# Patient Record
Sex: Female | Born: 1975 | Hispanic: Yes | Marital: Married | Smoking: Never smoker
Health system: Southern US, Community
[De-identification: ages and names within clinical notes are randomized; demographics above are authoritative.]

## PROBLEM LIST (undated history)

## (undated) DIAGNOSIS — E782 Mixed hyperlipidemia: Secondary | ICD-10-CM

## (undated) DIAGNOSIS — R7303 Prediabetes: Secondary | ICD-10-CM

## (undated) DIAGNOSIS — J309 Allergic rhinitis, unspecified: Secondary | ICD-10-CM

## (undated) HISTORY — DX: Mixed hyperlipidemia: E78.2

## (undated) HISTORY — DX: Allergic rhinitis, unspecified: J30.9

## (undated) HISTORY — DX: Prediabetes: R73.03

---

## 2017-12-06 ENCOUNTER — Other Ambulatory Visit: Payer: Self-pay | Admitting: Obstetrics and Gynecology

## 2017-12-06 DIAGNOSIS — Z1231 Encounter for screening mammogram for malignant neoplasm of breast: Secondary | ICD-10-CM

## 2017-12-28 ENCOUNTER — Ambulatory Visit
Admission: RE | Admit: 2017-12-28 | Discharge: 2017-12-28 | Disposition: A | Discharge status: Home / Self Care | Payer: 59 | Source: Ambulatory Visit | Attending: Obstetrics and Gynecology | Admitting: Obstetrics and Gynecology

## 2017-12-28 ENCOUNTER — Encounter: Payer: Self-pay | Admitting: Radiology

## 2017-12-28 DIAGNOSIS — Z1231 Encounter for screening mammogram for malignant neoplasm of breast: Secondary | ICD-10-CM | POA: Diagnosis present

## 2018-10-16 ENCOUNTER — Telehealth: Payer: Self-pay | Admitting: Obstetrics & Gynecology

## 2018-10-16 NOTE — Telephone Encounter (Signed)
[facility] referring for Irregular Menses. Called and left voicemail for patient to call back to be schedule

## 2018-10-17 NOTE — Telephone Encounter (Signed)
Called and left voice mail for patient to call back to be schedule °

## 2018-10-18 NOTE — Telephone Encounter (Signed)
Called and left voice mail for patient to call back to be schedule °

## 2018-10-23 NOTE — Telephone Encounter (Signed)
Called with spanish interpreter left voicemail for patient to call back to be schedule

## 2019-02-11 ENCOUNTER — Other Ambulatory Visit: Payer: Self-pay | Admitting: Internal Medicine

## 2019-02-11 DIAGNOSIS — Z1231 Encounter for screening mammogram for malignant neoplasm of breast: Secondary | ICD-10-CM

## 2019-03-08 ENCOUNTER — Ambulatory Visit
Admission: RE | Admit: 2019-03-08 | Discharge: 2019-03-08 | Disposition: A | Discharge status: Home / Self Care | Payer: 59 | Source: Ambulatory Visit | Attending: Internal Medicine | Admitting: Internal Medicine

## 2019-03-08 DIAGNOSIS — Z1231 Encounter for screening mammogram for malignant neoplasm of breast: Secondary | ICD-10-CM | POA: Diagnosis not present

## 2019-03-19 ENCOUNTER — Encounter: Payer: Self-pay | Admitting: Obstetrics and Gynecology

## 2019-03-19 ENCOUNTER — Other Ambulatory Visit: Payer: Self-pay

## 2019-03-19 ENCOUNTER — Ambulatory Visit (INDEPENDENT_AMBULATORY_CARE_PROVIDER_SITE_OTHER): Payer: 59 | Admitting: Obstetrics and Gynecology

## 2019-03-19 VITALS — BP 100/60 | Ht 59.0 in | Wt 166.0 lb

## 2019-03-19 DIAGNOSIS — B373 Candidiasis of vulva and vagina: Secondary | ICD-10-CM

## 2019-03-19 DIAGNOSIS — N912 Amenorrhea, unspecified: Secondary | ICD-10-CM | POA: Diagnosis not present

## 2019-03-19 DIAGNOSIS — B3731 Acute candidiasis of vulva and vagina: Secondary | ICD-10-CM

## 2019-03-19 LAB — POCT WET PREP WITH KOH
Clue Cells Wet Prep HPF POC: NEGATIVE
KOH Prep POC: NEGATIVE
Trichomonas, UA: NEGATIVE
Yeast Wet Prep HPF POC: NEGATIVE

## 2019-03-19 MED ORDER — FLUCONAZOLE 150 MG PO TABS: 150.0000 mg | ORAL_TABLET | Freq: Once | ORAL | 0 refills | Status: AC

## 2019-03-19 NOTE — Patient Instructions (Signed)
I value your feedback and entrusting us with your care. If you get a [facility] patient survey, I would appreciate you taking the time to let us know about your experience today. Thank you!  As of December 13, 2018, your lab results will be released to your MyChart immediately, before I even have a chance to see them. Please give me time to review them and contact you if there are any abnormalities. Thank you for your patience.  

## 2019-03-19 NOTE — Progress Notes (Signed)
Medicine, [facility]   Chief Complaint  Patient presents with  . Metrorrhagia    last period was in Oct  . Vaginal Discharge    itching, no odor x 1 week    HPI:      Ms. Linda Simon is a 44 y.o. No obstetric history on file. who LMP was Patient's last menstrual period was 10/08/2018 (approximate)., presents today for NP eval of amenorrhea and vaginal symptoms. Pt is on OCPs, usually has monthly menses, lasting 3 days, no BTB, mild dysmen in back, improved with tylenol. No menses since 10/20. Has had stress and weight changes. Unusual for pt.  She is sex active, using OCPs. No pain/postcoital bleeding.  Also with increased vag d/c with itch/irritation, no fishy odor, for the past wk. No meds to treat, no recent abx use. Uses dove sensitive skin soap, no detergent/soap changes.   Neg pap/neg HPV DNA 12/01/16 at [facility]  History reviewed. No pertinent past medical history.  History reviewed. No pertinent surgical history.  Family History  Problem Relation Age of Onset  . Diabetes Mother     Social History   Socioeconomic History  . Marital status: Married    Spouse name: Not on file  . Number of children: Not on file  . Years of education: Not on file  . Highest education level: Not on file  Occupational History  . Not on file  Tobacco Use  . Smoking status: Never Smoker  . Smokeless tobacco: Never Used  Substance and Sexual Activity  . Alcohol use: Never  . Drug use: Never  . Sexual activity: Yes    Birth control/protection: Pill  Other Topics Concern  . Not on file  Social History Narrative  . Not on file   Social Determinants of Health   Financial Resource Strain:   . Difficulty of Paying Living Expenses:   Food Insecurity:   . Worried About Programme researcher, broadcasting/film/video in the Last Year:   . Barista in the Last Year:   Transportation Needs:   . Freight forwarder (Medical):   Marland Kitchen Lack of Transportation (Non-Medical):   Physical Activity:   . Days  of Exercise per Week:   . Minutes of Exercise per Session:   Stress:   . Feeling of Stress :   Social Connections:   . Frequency of Communication with Friends and Family:   . Frequency of Social Gatherings with Friends and Family:   . Attends Religious Services:   . Active Member of Clubs or Organizations:   . Attends Banker Meetings:   Marland Kitchen Marital Status:   Intimate Partner Violence:   . Fear of Current or Ex-Partner:   . Emotionally Abused:   Marland Kitchen Physically Abused:   . Sexually Abused:     Outpatient Medications Prior to Visit  Medication Sig Dispense Refill  . Norethindrone Acetate-Ethinyl Estrad-FE (LOESTRIN 24 FE) 1-20 MG-MCG(24) tablet Take by mouth.     No facility-administered medications prior to visit.      ROS:  Review of Systems  Constitutional: Negative for fatigue, fever and unexpected weight change.  Respiratory: Negative for cough, shortness of breath and wheezing.   Cardiovascular: Negative for chest pain, palpitations and leg swelling.  Gastrointestinal: Negative for blood in stool, constipation, diarrhea, nausea and vomiting.  Endocrine: Negative for cold intolerance, heat intolerance and polyuria.  Genitourinary: Positive for menstrual problem and vaginal discharge. Negative for dyspareunia, dysuria, flank pain, frequency, genital sores, hematuria,  pelvic pain, urgency, vaginal bleeding and vaginal pain.  Musculoskeletal: Negative for back pain, joint swelling and myalgias.  Skin: Negative for rash.  Neurological: Negative for dizziness, syncope, light-headedness, numbness and headaches.  Hematological: Negative for adenopathy.  Psychiatric/Behavioral: Negative for agitation, confusion, sleep disturbance and suicidal ideas. The patient is not nervous/anxious.   BREAST: No symptoms   OBJECTIVE:   Vitals:  BP 100/60   Ht 4\' 11"  (1.499 m)   Wt 166 lb (75.3 kg)   LMP 10/08/2018 (Approximate)   BMI 33.53 kg/m   Physical Exam Vitals  reviewed.  Constitutional:      Appearance: She is well-developed.  Pulmonary:     Effort: Pulmonary effort is normal.  Genitourinary:    General: Normal vulva.     Pubic Area: No rash.      Labia:        Right: No rash, tenderness or lesion.        Left: No rash, tenderness or lesion.      Vagina: Vaginal discharge present. No erythema or tenderness.     Cervix: Normal.     Uterus: Normal. Not enlarged and not tender.      Adnexa: Right adnexa normal and left adnexa normal.       Right: No mass or tenderness.         Left: No mass or tenderness.    Musculoskeletal:        General: Normal range of motion.     Cervical back: Normal range of motion.  Skin:    General: Skin is warm and dry.  Neurological:     General: No focal deficit present.     Mental Status: She is alert and oriented to person, place, and time.  Psychiatric:        Mood and Affect: Mood normal.        Behavior: Behavior normal.        Thought Content: Thought content normal.        Judgment: Judgment normal.     Results: Results for orders placed or performed in visit on 03/19/19 (from the past 24 hour(s))  POCT Wet Prep with KOH     Status: Normal   Collection Time: 03/19/19  4:14 PM  Result Value Ref Range   Trichomonas, UA Negative    Clue Cells Wet Prep HPF POC neg    Epithelial Wet Prep HPF POC     Yeast Wet Prep HPF POC neg    Bacteria Wet Prep HPF POC     RBC Wet Prep HPF POC     WBC Wet Prep HPF POC     KOH Prep POC Negative Negative     Assessment/Plan: Amenorrhea - Plan: TSH; Most likely due to OCPs. Check TSH. If WNL, reassurance. F/u prn.   Candidal vaginitis - Plan: fluconazole (DIFLUCAN) 150 MG tablet, POCT Wet Prep with KOH; Pos sx and exam, neg wet prep. Rx diflucan. F/u prn.    Meds ordered this encounter  Medications  . fluconazole (DIFLUCAN) 150 MG tablet    Sig: Take 1 tablet (150 mg total) by mouth once for 1 dose.    Dispense:  1 tablet    Refill:  0    Order  Specific Question:   Supervising Provider    Answer:   Gae Dry [[id number]]      Return if symptoms worsen or fail to improve.  Tyriq Moragne B. Yemaya Barnier, PA-C 03/19/2019 4:15 PM

## 2019-03-20 LAB — TSH: TSH: 2.07 u[IU]/mL (ref 0.450–4.500)

## 2019-03-20 NOTE — Progress Notes (Signed)
Called pt, no answer, LVMTRC. 

## 2019-03-20 NOTE — Progress Notes (Signed)
Pls let pt know her thyroid is normal. No bleeding on birth control can be from weight changes and stress. She can also do home UPT just to make sure. She speaks [place]. Thx.

## 2019-03-20 NOTE — Progress Notes (Signed)
Pt aware.

## 2019-03-24 ENCOUNTER — Ambulatory Visit: Payer: 59 | Attending: Internal Medicine

## 2019-03-24 DIAGNOSIS — Z23 Encounter for immunization: Secondary | ICD-10-CM

## 2019-03-24 NOTE — Progress Notes (Signed)
   Covid-19 Vaccination Clinic  Name:  Linda Simon    MRN: [id number] DOB: 1975/10/10  03/24/2019  [name] was observed post Covid-19 immunization for 15 minutes without incident. She was provided with Vaccine Information Sheet and instruction to access the V-Safe system.   [name] was instructed to call 911 with any severe reactions post vaccine: Marland Kitchen Difficulty breathing  . Swelling of face and throat  . A fast heartbeat  . A bad rash all over body  . Dizziness and weakness   Immunizations Administered    Name Date Dose VIS Date Route   Pfizer COVID-19 Vaccine 03/24/2019  6:50 PM 0.3 mL 12/14/2018 Intramuscular   Manufacturer: [employer], [employer]   Lot: [id number]   NDC: [address]-1

## 2019-04-14 ENCOUNTER — Other Ambulatory Visit: Payer: Self-pay

## 2019-04-14 ENCOUNTER — Ambulatory Visit: Payer: 59 | Attending: Internal Medicine

## 2019-04-14 DIAGNOSIS — Z23 Encounter for immunization: Secondary | ICD-10-CM

## 2019-04-14 NOTE — Progress Notes (Addendum)
   Covid-19 Vaccination Clinic  Name:  Linda Simon    MRN: [id number] DOB: 08-15-1975  04/14/2019  [name] was observed post Covid-19 immunization for 15 minutes without incident. She was provided with Vaccine Information Sheet and instruction to access the V-Safe system. Medical interpreter used.  [name] was instructed to call 911 with any severe reactions post vaccine: Marland Kitchen Difficulty breathing  . Swelling of face and throat  . A fast heartbeat  . A bad rash all over body  . Dizziness and weakness   Immunizations Administered    Name Date Dose VIS Date Route   Pfizer COVID-19 Vaccine 04/14/2019  4:47 PM 0.3 mL 12/14/2018 Intramuscular   Manufacturer: [employer], [employer]   Lot: [id number] [id number]   NDC: [address]-2

## 2020-06-11 ENCOUNTER — Other Ambulatory Visit: Payer: Self-pay | Admitting: Family

## 2020-06-11 DIAGNOSIS — Z1231 Encounter for screening mammogram for malignant neoplasm of breast: Secondary | ICD-10-CM

## 2020-07-10 ENCOUNTER — Ambulatory Visit
Admission: RE | Admit: 2020-07-10 | Discharge: 2020-07-10 | Disposition: A | Discharge status: Home / Self Care | Payer: 59 | Source: Ambulatory Visit | Attending: Family | Admitting: Family

## 2020-07-10 ENCOUNTER — Other Ambulatory Visit: Payer: Self-pay

## 2020-07-10 DIAGNOSIS — Z1231 Encounter for screening mammogram for malignant neoplasm of breast: Secondary | ICD-10-CM | POA: Insufficient documentation

## 2021-09-02 ENCOUNTER — Other Ambulatory Visit: Payer: Self-pay | Admitting: Family

## 2021-09-02 DIAGNOSIS — Z1231 Encounter for screening mammogram for malignant neoplasm of breast: Secondary | ICD-10-CM

## 2021-09-27 ENCOUNTER — Encounter: Payer: Self-pay | Admitting: Radiology

## 2021-09-27 ENCOUNTER — Ambulatory Visit
Admission: RE | Admit: 2021-09-27 | Discharge: 2021-09-27 | Disposition: A | Discharge status: Home / Self Care | Payer: 59 | Source: Ambulatory Visit | Attending: Family | Admitting: Family

## 2021-09-27 DIAGNOSIS — Z1231 Encounter for screening mammogram for malignant neoplasm of breast: Secondary | ICD-10-CM | POA: Diagnosis present

## 2022-03-15 ENCOUNTER — Other Ambulatory Visit: Payer: 59

## 2022-03-15 ENCOUNTER — Other Ambulatory Visit: Payer: Self-pay | Admitting: Family

## 2022-03-15 DIAGNOSIS — E559 Vitamin D deficiency, unspecified: Secondary | ICD-10-CM

## 2022-03-15 DIAGNOSIS — I10 Essential (primary) hypertension: Secondary | ICD-10-CM

## 2022-03-15 DIAGNOSIS — E782 Mixed hyperlipidemia: Secondary | ICD-10-CM

## 2022-03-15 DIAGNOSIS — E538 Deficiency of other specified B group vitamins: Secondary | ICD-10-CM

## 2022-03-15 DIAGNOSIS — R7303 Prediabetes: Secondary | ICD-10-CM

## 2022-03-16 LAB — LIPID PANEL
Chol/HDL Ratio: 3.5 ratio (ref 0.0–4.4)
Cholesterol, Total: 170 mg/dL (ref 100–199)
HDL: 48 mg/dL (ref >39)
LDL Chol Calc (NIH): 108 mg/dL — ABNORMAL HIGH (ref 0–99)
Triglycerides: 74 mg/dL (ref 0–149)
VLDL Cholesterol Cal: 14 mg/dL (ref 5–40)

## 2022-03-16 LAB — CBC WITH DIFFERENTIAL
Basophils Absolute: 0.1 10*3/uL (ref 0.0–0.2)
Basos: 1 %
EOS (ABSOLUTE): 0.2 10*3/uL (ref 0.0–0.4)
Eos: 3 %
Hematocrit: 41.9 % (ref 34.0–46.6)
Hemoglobin: 13.5 g/dL (ref 11.1–15.9)
Immature Grans (Abs): 0 10*3/uL (ref 0.0–0.1)
Immature Granulocytes: 0 %
Lymphocytes Absolute: 2.2 10*3/uL (ref 0.7–3.1)
Lymphs: 33 %
MCH: 30 pg (ref 26.6–33.0)
MCHC: 32.2 g/dL (ref 31.5–35.7)
MCV: 93 fL (ref 79–97)
Monocytes Absolute: 0.5 10*3/uL (ref 0.1–0.9)
Monocytes: 7 %
Neutrophils Absolute: 3.8 10*3/uL (ref 1.4–7.0)
Neutrophils: 56 %
RBC: 4.5 x10E6/uL (ref 3.77–5.28)
RDW: 12.5 % (ref 11.7–15.4)
WBC: 6.8 10*3/uL (ref 3.4–10.8)

## 2022-03-16 LAB — CMP14+EGFR
ALT: 12 IU/L (ref 0–32)
AST: 17 IU/L (ref 0–40)
Albumin/Globulin Ratio: 1.6 (ref 1.2–2.2)
Albumin: 4.3 g/dL (ref 3.9–4.9)
Alkaline Phosphatase: 78 IU/L (ref 44–121)
BUN/Creatinine Ratio: 17 (ref 9–23)
BUN: 12 mg/dL (ref 6–24)
Bilirubin Total: 0.2 mg/dL (ref 0.0–1.2)
CO2: 21 mmol/L (ref 20–29)
Calcium: 9.2 mg/dL (ref 8.7–10.2)
Chloride: 106 mmol/L (ref 96–106)
Creatinine, Ser: 0.71 mg/dL (ref 0.57–1.00)
Globulin, Total: 2.7 g/dL (ref 1.5–4.5)
Glucose: 90 mg/dL (ref 70–99)
Potassium: 4.7 mmol/L (ref 3.5–5.2)
Sodium: 142 mmol/L (ref 134–144)
Total Protein: 7 g/dL (ref 6.0–8.5)
eGFR: 107 mL/min/{1.73_m2} (ref >59)

## 2022-03-16 LAB — VITAMIN D 25 HYDROXY (VIT D DEFICIENCY, FRACTURES): Vit D, 25-Hydroxy: 33.4 ng/mL (ref 30.0–100.0)

## 2022-03-16 LAB — VITAMIN B12: Vitamin B-12: 229 pg/mL — ABNORMAL LOW (ref 232–1245)

## 2022-03-16 LAB — HEMOGLOBIN A1C
Est. average glucose Bld gHb Est-mCnc: 120 mg/dL
Hgb A1c MFr Bld: 5.8 % — ABNORMAL HIGH (ref 4.8–5.6)

## 2022-03-17 ENCOUNTER — Ambulatory Visit: Payer: 59 | Admitting: Family

## 2022-03-17 VITALS — BP 130/78 | HR 70 | Ht 60.0 in | Wt 163.0 lb

## 2022-03-17 DIAGNOSIS — R7303 Prediabetes: Secondary | ICD-10-CM | POA: Diagnosis not present

## 2022-03-17 DIAGNOSIS — E782 Mixed hyperlipidemia: Secondary | ICD-10-CM

## 2022-03-17 DIAGNOSIS — J301 Allergic rhinitis due to pollen: Secondary | ICD-10-CM | POA: Diagnosis not present

## 2022-03-17 MED ORDER — FEXOFENADINE HCL 180 MG PO TABS: 180.0000 mg | ORAL_TABLET | Freq: Every day | ORAL | 1 refills | Status: DC

## 2022-03-17 MED ORDER — AZELASTINE HCL 0.1 % NA SOLN: 1.0000 | Freq: Two times a day (BID) | NASAL | 12 refills | Status: AC

## 2022-03-17 NOTE — Progress Notes (Incomplete)
Established Patient Office Visit  Subjective:  Patient ID: Linda Simon, female    DOB: 08/05/1975  Age: 47 y.o. MRN: [id number]  Chief Complaint  Patient presents with  . Follow-up    6 month follow up    HPI   No past medical history on file.  No past surgical history on file.  Social History   Socioeconomic History  . Marital status: Married    Spouse name: Not on file  . Number of children: Not on file  . Years of education: Not on file  . Highest education level: Not on file  Occupational History  . Not on file  Tobacco Use  . Smoking status: Never  . Smokeless tobacco: Never  Vaping Use  . Vaping Use: Never used  Substance and Sexual Activity  . Alcohol use: Never  . Drug use: Never  . Sexual activity: Yes    Birth control/protection: Pill  Other Topics Concern  . Not on file  Social History Narrative  . Not on file   Social Determinants of Health   Financial Resource Strain: Not on file  Food Insecurity: Not on file  Transportation Needs: Not on file  Physical Activity: Not on file  Stress: Not on file  Social Connections: Not on file  Intimate Partner Violence: Not on file    Family History  Problem Relation Age of Onset  . Diabetes Mother     No Known Allergies  ROS     Objective:   BP 130/78   Pulse 70   Ht 5' (1.524 m)   Wt 163 lb (73.9 kg)   SpO2 99%   BMI 31.83 kg/m   Vitals:   03/17/22 1527  BP: 130/78  Pulse: 70  Height: 5' (1.524 m)  Weight: 163 lb (73.9 kg)  SpO2: 99%  BMI (Calculated): 31.83    Physical Exam   No results found for any visits on 03/17/22.  Recent Results (from the past 2160 hour(s))  Lipid panel     Status: Abnormal   Collection Time: 03/15/22  4:08 PM  Result Value Ref Range   Cholesterol, Total 170 100 - 199 mg/dL   Triglycerides 74 0 - 149 mg/dL   HDL 48 >39 mg/dL   VLDL Cholesterol Cal 14 5 - 40 mg/dL   LDL Chol Calc (NIH) 108 (H) 0 - 99 mg/dL   Chol/HDL Ratio 3.5 0.0 - 4.4 ratio     Comment:                                   T. Chol/HDL Ratio                                             Men  Women                               1/2 Avg.Risk  3.4    3.3                                   Avg.Risk  5.0    4.4  2X Avg.Risk  9.6    7.1                                3X Avg.Risk 23.4   11.0   VITAMIN D 25 Hydroxy (Vit-D Deficiency, Fractures)     Status: None   Collection Time: 03/15/22  4:08 PM  Result Value Ref Range   Vit D, 25-Hydroxy 33.4 30.0 - 100.0 ng/mL    Comment: Vitamin D deficiency has been defined by the [place] practice guideline as a level of serum 25-OH vitamin D less than 20 ng/mL (1,2). The Endocrine Society went on to further define vitamin D insufficiency as a level between 21 and 29 ng/mL (2). 1. IOM (Institute of Medicine). 2010. Dietary reference    intakes for calcium and D. [place]: The    [employer]. 2. Holick MF, Binkley [place], Bischoff-Ferrari HA, et al.    Evaluation, treatment, and prevention of vitamin D    deficiency: an Endocrine Society clinical practice    guideline. JCEM. 2011 Jul; 96(7):1911-30.   CBC With Differential     Status: None   Collection Time: 03/15/22  4:08 PM  Result Value Ref Range   WBC 6.8 3.4 - 10.8 x10E3/uL   RBC 4.50 3.77 - 5.28 x10E6/uL   Hemoglobin 13.5 11.1 - 15.9 g/dL   Hematocrit 41.9 34.0 - 46.6 %   MCV 93 79 - 97 fL   MCH 30.0 26.6 - 33.0 pg   MCHC 32.2 31.5 - 35.7 g/dL   RDW 12.5 11.7 - 15.4 %   Neutrophils 56 Not Estab. %   Lymphs 33 Not Estab. %   Monocytes 7 Not Estab. %   Eos 3 Not Estab. %   Basos 1 Not Estab. %   Neutrophils Absolute 3.8 1.4 - 7.0 x10E3/uL   Lymphocytes Absolute 2.2 0.7 - 3.1 x10E3/uL   Monocytes Absolute 0.5 0.1 - 0.9 x10E3/uL   EOS (ABSOLUTE) 0.2 0.0 - 0.4 x10E3/uL   Basophils Absolute 0.1 0.0 - 0.2 x10E3/uL   Immature Granulocytes 0 Not Estab. %   Immature Grans (Abs) 0.0 0.0 -  0.1 x10E3/uL  CMP14+EGFR     Status: None   Collection Time: 03/15/22  4:08 PM  Result Value Ref Range   Glucose 90 70 - 99 mg/dL   BUN 12 6 - 24 mg/dL   Creatinine, Ser 0.71 0.57 - 1.00 mg/dL   eGFR 107 >59 mL/min/1.73   BUN/Creatinine Ratio 17 9 - 23   Sodium 142 134 - 144 mmol/L   Potassium 4.7 3.5 - 5.2 mmol/L   Chloride 106 96 - 106 mmol/L   CO2 21 20 - 29 mmol/L   Calcium 9.2 8.7 - 10.2 mg/dL   Total Protein 7.0 6.0 - 8.5 g/dL   Albumin 4.3 3.9 - 4.9 g/dL   Globulin, Total 2.7 1.5 - 4.5 g/dL   Albumin/Globulin Ratio 1.6 1.2 - 2.2   Bilirubin Total 0.2 0.0 - 1.2 mg/dL   Alkaline Phosphatase 78 44 - 121 IU/L   AST 17 0 - 40 IU/L   ALT 12 0 - 32 IU/L  Hemoglobin A1c     Status: Abnormal   Collection Time: 03/15/22  4:08 PM  Result Value Ref Range   Hgb A1c MFr Bld 5.8 (H) 4.8 - 5.6 %    Comment:  Prediabetes: 5.7 - 6.4          Diabetes: >6.4          Glycemic control for adults with diabetes: <7.0    Est. average glucose Bld gHb Est-mCnc 120 mg/dL  Vitamin B12     Status: Abnormal   Collection Time: 03/15/22  4:08 PM  Result Value Ref Range   Vitamin B-12 229 (L) 232 - 1,245 pg/mL      Assessment & Plan:   Problem List Items Addressed This Visit   None   No follow-ups on file.   Total time spent: {AMA time spent:29001} minutes  Mechele Claude, FNP  03/17/2022

## 2022-03-18 DIAGNOSIS — E782 Mixed hyperlipidemia: Secondary | ICD-10-CM | POA: Insufficient documentation

## 2022-03-18 DIAGNOSIS — R7303 Prediabetes: Secondary | ICD-10-CM | POA: Insufficient documentation

## 2022-03-18 DIAGNOSIS — J301 Allergic rhinitis due to pollen: Secondary | ICD-10-CM | POA: Insufficient documentation

## 2022-03-19 ENCOUNTER — Encounter: Payer: Self-pay | Admitting: Family

## 2022-03-19 NOTE — Progress Notes (Signed)
Established Patient Office Visit  Subjective:  Patient ID: Linda Simon, female    DOB: 1975-02-06  Age: 47 y.o. MRN: [id number]  Chief Complaint  Patient presents with   Follow-up    6 month follow up    Patient is here today for her 6 months follow up.  She has been feeling well since last appointment.   She does have additional concerns to discuss today.  Mostly her allergies have started to act up, and she asks if there is anything we can do about that.  She says that the meds that we sent her last year did work.   Labs were done earlier this week, so we will review today. Her LDL is still elevated, and her A1C is still in prediabetic ranges.  We have discussed the need to watch her intake carefully.   She needs refills.   I have reviewed her active problem list, medication list, allergies, family history, and recent lab results for her appointment today.     No other concerns at this time.   Past Medical History:  Diagnosis Date   Allergic rhinitis    Mixed hyperlipidemia    Prediabetes     No past surgical history on file.  Social History   Socioeconomic History   Marital status: Married    Spouse name: Not on file   Number of children: Not on file   Years of education: Not on file   Highest education level: Not on file  Occupational History   Not on file  Tobacco Use   Smoking status: Never   Smokeless tobacco: Never  Vaping Use   Vaping Use: Never used  Substance and Sexual Activity   Alcohol use: Never   Drug use: Never   Sexual activity: Yes    Birth control/protection: Pill  Other Topics Concern   Not on file  Social History Narrative   Not on file   Social Determinants of Health   Financial Resource Strain: Not on file  Food Insecurity: Not on file  Transportation Needs: Not on file  Physical Activity: Not on file  Stress: Not on file  Social Connections: Not on file  Intimate Partner Violence: Not on file    Family History   Problem Relation Age of Onset   Diabetes Mother     No Known Allergies  Review of Systems  HENT:  Positive for congestion.   All other systems reviewed and are negative.      Objective:   BP 130/78   Pulse 70   Ht 5' (1.524 m)   Wt 163 lb (73.9 kg)   SpO2 99%   BMI 31.83 kg/m   Vitals:   03/17/22 1527  BP: 130/78  Pulse: 70  Height: 5' (1.524 m)  Weight: 163 lb (73.9 kg)  SpO2: 99%  BMI (Calculated): 31.83    Physical Exam Vitals and nursing note reviewed.  Constitutional:      Appearance: Normal appearance. She is normal weight.  HENT:     Head: Normocephalic.     Nose: Congestion and rhinorrhea present.  Eyes:     Extraocular Movements: Extraocular movements intact.     Conjunctiva/sclera: Conjunctivae normal.     Pupils: Pupils are equal, round, and reactive to light.  Cardiovascular:     Rate and Rhythm: Normal rate and regular rhythm.     Pulses: Normal pulses.     Heart sounds: Normal heart sounds.  Pulmonary:     Effort: Pulmonary  effort is normal.     Breath sounds: Normal breath sounds.  Skin:    Capillary Refill: Capillary refill takes less than 2 seconds.  Neurological:     General: No focal deficit present.     Mental Status: She is alert and oriented to person, place, and time.  Psychiatric:        Mood and Affect: Mood normal.        Behavior: Behavior normal.        Thought Content: Thought content normal.        Judgment: Judgment normal.      No results found for any visits on 03/17/22.  Recent Results (from the past 2160 hour(s))  Lipid panel     Status: Abnormal   Collection Time: 03/15/22  4:08 PM  Result Value Ref Range   Cholesterol, Total 170 100 - 199 mg/dL   Triglycerides 74 0 - 149 mg/dL   HDL 48 >39 mg/dL   VLDL Cholesterol Cal 14 5 - 40 mg/dL   LDL Chol Calc (NIH) 108 (H) 0 - 99 mg/dL   Chol/HDL Ratio 3.5 0.0 - 4.4 ratio    Comment:                                   T. Chol/HDL Ratio                                              Men  Women                               1/2 Avg.Risk  3.4    3.3                                   Avg.Risk  5.0    4.4                                2X Avg.Risk  9.6    7.1                                3X Avg.Risk 23.4   11.0   VITAMIN D 25 Hydroxy (Vit-D Deficiency, Fractures)     Status: None   Collection Time: 03/15/22  4:08 PM  Result Value Ref Range   Vit D, 25-Hydroxy 33.4 30.0 - 100.0 ng/mL    Comment: Vitamin D deficiency has been defined by the [place] practice guideline as a level of serum 25-OH vitamin D less than 20 ng/mL (1,2). The Endocrine Society went on to further define vitamin D insufficiency as a level between 21 and 29 ng/mL (2). 1. IOM (Institute of Medicine). 2010. Dietary reference    intakes for calcium and D. [place]: The    [employer]. 2. Holick MF, Binkley [place], Bischoff-Ferrari HA, et al.    Evaluation, treatment, and prevention of vitamin D    deficiency: an Endocrine Society clinical practice    guideline. JCEM. 2011 Jul; 96(7):1911-30.   CBC With Differential  Status: None   Collection Time: 03/15/22  4:08 PM  Result Value Ref Range   WBC 6.8 3.4 - 10.8 x10E3/uL   RBC 4.50 3.77 - 5.28 x10E6/uL   Hemoglobin 13.5 11.1 - 15.9 g/dL   Hematocrit 41.9 34.0 - 46.6 %   MCV 93 79 - 97 fL   MCH 30.0 26.6 - 33.0 pg   MCHC 32.2 31.5 - 35.7 g/dL   RDW 12.5 11.7 - 15.4 %   Neutrophils 56 Not Estab. %   Lymphs 33 Not Estab. %   Monocytes 7 Not Estab. %   Eos 3 Not Estab. %   Basos 1 Not Estab. %   Neutrophils Absolute 3.8 1.4 - 7.0 x10E3/uL   Lymphocytes Absolute 2.2 0.7 - 3.1 x10E3/uL   Monocytes Absolute 0.5 0.1 - 0.9 x10E3/uL   EOS (ABSOLUTE) 0.2 0.0 - 0.4 x10E3/uL   Basophils Absolute 0.1 0.0 - 0.2 x10E3/uL   Immature Granulocytes 0 Not Estab. %   Immature Grans (Abs) 0.0 0.0 - 0.1 x10E3/uL  CMP14+EGFR     Status: None   Collection Time: 03/15/22  4:08 PM  Result  Value Ref Range   Glucose 90 70 - 99 mg/dL   BUN 12 6 - 24 mg/dL   Creatinine, Ser 0.71 0.57 - 1.00 mg/dL   eGFR 107 >59 mL/min/1.73   BUN/Creatinine Ratio 17 9 - 23   Sodium 142 134 - 144 mmol/L   Potassium 4.7 3.5 - 5.2 mmol/L   Chloride 106 96 - 106 mmol/L   CO2 21 20 - 29 mmol/L   Calcium 9.2 8.7 - 10.2 mg/dL   Total Protein 7.0 6.0 - 8.5 g/dL   Albumin 4.3 3.9 - 4.9 g/dL   Globulin, Total 2.7 1.5 - 4.5 g/dL   Albumin/Globulin Ratio 1.6 1.2 - 2.2   Bilirubin Total 0.2 0.0 - 1.2 mg/dL   Alkaline Phosphatase 78 44 - 121 IU/L   AST 17 0 - 40 IU/L   ALT 12 0 - 32 IU/L  Hemoglobin A1c     Status: Abnormal   Collection Time: 03/15/22  4:08 PM  Result Value Ref Range   Hgb A1c MFr Bld 5.8 (H) 4.8 - 5.6 %    Comment:          Prediabetes: 5.7 - 6.4          Diabetes: >6.4          Glycemic control for adults with diabetes: <7.0    Est. average glucose Bld gHb Est-mCnc 120 mg/dL  Vitamin B12     Status: Abnormal   Collection Time: 03/15/22  4:08 PM  Result Value Ref Range   Vitamin B-12 229 (L) 232 - 1,245 pg/mL      Assessment & Plan:   Problem List Items Addressed This Visit     Allergic rhinitis due to pollen - Primary   Mixed hyperlipidemia   Prediabetes    No follow-ups on file.   Total time spent: 20 minutes  Mechele Claude, FNP  03/17/2022

## 2022-08-03 ENCOUNTER — Other Ambulatory Visit: Payer: Self-pay | Admitting: Family

## 2022-09-06 ENCOUNTER — Other Ambulatory Visit: Payer: Self-pay | Admitting: Family

## 2022-09-06 DIAGNOSIS — Z1231 Encounter for screening mammogram for malignant neoplasm of breast: Secondary | ICD-10-CM

## 2022-09-15 ENCOUNTER — Other Ambulatory Visit: Payer: No Typology Code available for payment source

## 2022-09-15 DIAGNOSIS — E782 Mixed hyperlipidemia: Secondary | ICD-10-CM

## 2022-09-15 DIAGNOSIS — R7303 Prediabetes: Secondary | ICD-10-CM

## 2022-09-15 DIAGNOSIS — I10 Essential (primary) hypertension: Secondary | ICD-10-CM

## 2022-09-16 LAB — CMP14+EGFR
ALT: 16 IU/L (ref 0–32)
AST: 17 IU/L (ref 0–40)
Albumin: 4.3 g/dL (ref 3.9–4.9)
Alkaline Phosphatase: 86 IU/L (ref 44–121)
BUN/Creatinine Ratio: 17 (ref 9–23)
BUN: 11 mg/dL (ref 6–24)
Bilirubin Total: 0.3 mg/dL (ref 0.0–1.2)
CO2: 20 mmol/L (ref 20–29)
Calcium: 9.2 mg/dL (ref 8.7–10.2)
Chloride: 107 mmol/L — ABNORMAL HIGH (ref 96–106)
Creatinine, Ser: 0.66 mg/dL (ref 0.57–1.00)
Globulin, Total: 2.6 g/dL (ref 1.5–4.5)
Glucose: 89 mg/dL (ref 70–99)
Potassium: 4.6 mmol/L (ref 3.5–5.2)
Sodium: 143 mmol/L (ref 134–144)
Total Protein: 6.9 g/dL (ref 6.0–8.5)
eGFR: 110 mL/min/{1.73_m2} (ref >59)

## 2022-09-16 LAB — TSH: TSH: 1.15 u[IU]/mL (ref 0.450–4.500)

## 2022-09-16 LAB — HEMOGLOBIN A1C
Est. average glucose Bld gHb Est-mCnc: 117 mg/dL
Hgb A1c MFr Bld: 5.7 % — ABNORMAL HIGH (ref 4.8–5.6)

## 2022-09-16 LAB — CBC WITH DIFFERENTIAL/PLATELET
Basophils Absolute: 0.1 10*3/uL (ref 0.0–0.2)
Basos: 1 %
EOS (ABSOLUTE): 0.2 10*3/uL (ref 0.0–0.4)
Eos: 2 %
Hematocrit: 41.2 % (ref 34.0–46.6)
Hemoglobin: 13.4 g/dL (ref 11.1–15.9)
Immature Grans (Abs): 0 10*3/uL (ref 0.0–0.1)
Immature Granulocytes: 0 %
Lymphocytes Absolute: 2.1 10*3/uL (ref 0.7–3.1)
Lymphs: 30 %
MCH: 30.4 pg (ref 26.6–33.0)
MCHC: 32.5 g/dL (ref 31.5–35.7)
MCV: 93 fL (ref 79–97)
Monocytes Absolute: 0.5 10*3/uL (ref 0.1–0.9)
Monocytes: 7 %
Neutrophils Absolute: 4.2 10*3/uL (ref 1.4–7.0)
Neutrophils: 60 %
Platelets: 318 10*3/uL (ref 150–450)
RBC: 4.41 x10E6/uL (ref 3.77–5.28)
RDW: 12.6 % (ref 11.7–15.4)
WBC: 7.1 10*3/uL (ref 3.4–10.8)

## 2022-09-16 LAB — LIPID PANEL
Chol/HDL Ratio: 4.2 ratio (ref 0.0–4.4)
Cholesterol, Total: 166 mg/dL (ref 100–199)
HDL: 40 mg/dL (ref >39)
LDL Chol Calc (NIH): 108 mg/dL — ABNORMAL HIGH (ref 0–99)
Triglycerides: 95 mg/dL (ref 0–149)
VLDL Cholesterol Cal: 18 mg/dL (ref 5–40)

## 2022-09-19 ENCOUNTER — Ambulatory Visit: Payer: No Typology Code available for payment source | Admitting: Family

## 2022-09-29 ENCOUNTER — Ambulatory Visit
Admission: RE | Admit: 2022-09-29 | Discharge: 2022-09-29 | Disposition: A | Discharge status: Home / Self Care | Payer: No Typology Code available for payment source | Source: Ambulatory Visit | Attending: Family | Admitting: Family

## 2022-09-29 DIAGNOSIS — Z1231 Encounter for screening mammogram for malignant neoplasm of breast: Secondary | ICD-10-CM | POA: Diagnosis present

## 2022-10-10 ENCOUNTER — Ambulatory Visit: Payer: No Typology Code available for payment source | Admitting: Family

## 2022-10-10 ENCOUNTER — Encounter: Payer: Self-pay | Admitting: Family

## 2022-10-10 VITALS — BP 110/75 | HR 62 | Ht 60.0 in | Wt 165.2 lb

## 2022-10-10 DIAGNOSIS — D485 Neoplasm of uncertain behavior of skin: Secondary | ICD-10-CM | POA: Diagnosis not present

## 2022-11-13 NOTE — Progress Notes (Signed)
Established Patient Office Visit  Subjective:  Patient ID: Linda Simon, female    DOB: [birth year]  Age: 47 y.o. MRN: [id number]  Chief Complaint  Patient presents with   Follow-up    6 mo f/u    Patient is here today for her 6 months follow up.  She has been feeling fairly well since last appointment.   She does have additional concerns to discuss today.  She has a spot on her nose that she would like to see dermatology about, as she would like it removed.  She says that it will go away and reappear, but that it has recently been getting worse.   Labs are due today. She needs refills.   I have reviewed her active problem list, medication list, allergies, notes from last encounter, lab results for her appointment today.      No other concerns at this time.   Past Medical History:  Diagnosis Date   Allergic rhinitis    Mixed hyperlipidemia    Prediabetes     History reviewed. No pertinent surgical history.  Social History   Socioeconomic History   Marital status: Married    Spouse name: Not on file   Number of children: Not on file   Years of education: Not on file   Highest education level: Not on file  Occupational History   Not on file  Tobacco Use   Smoking status: Never   Smokeless tobacco: Never  Vaping Use   Vaping status: Never Used  Substance and Sexual Activity   Alcohol use: Never   Drug use: Never   Sexual activity: Yes    Birth control/protection: Pill  Other Topics Concern   Not on file  Social History Narrative   Not on file   Social Determinants of Health   Financial Resource Strain: Not on file  Food Insecurity: Not on file  Transportation Needs: Not on file  Physical Activity: Not on file  Stress: Not on file  Social Connections: Not on file  Intimate Partner Violence: Not on file    Family History  Problem Relation Age of Onset   Diabetes Mother    Breast cancer Neg Hx     No Known Allergies  Review of Systems  All  other systems reviewed and are negative.      Objective:   BP 110/75   Pulse 62   Ht 5' (1.524 m)   Wt 165 lb 3.2 oz (74.9 kg)   SpO2 99%   BMI 32.26 kg/m   Vitals:   10/10/22 1550  BP: 110/75  Pulse: 62  Height: 5' (1.524 m)  Weight: 165 lb 3.2 oz (74.9 kg)  SpO2: 99%  BMI (Calculated): 32.26    Physical Exam Vitals and nursing note reviewed.  Constitutional:      Appearance: Normal appearance. She is normal weight.  HENT:     Head: Normocephalic.  Eyes:     Extraocular Movements: Extraocular movements intact.     Conjunctiva/sclera: Conjunctivae normal.     Pupils: Pupils are equal, round, and reactive to light.  Cardiovascular:     Rate and Rhythm: Normal rate.  Pulmonary:     Effort: Pulmonary effort is normal.  Skin:    Findings: Lesion present.  Neurological:     General: No focal deficit present.     Mental Status: She is alert and oriented to person, place, and time. Mental status is at baseline.  Psychiatric:  Mood and Affect: Mood normal.        Behavior: Behavior normal.        Thought Content: Thought content normal.        Judgment: Judgment normal.      No results found for any visits on 10/10/22.  Recent Results (from the past 2160 hour(s))  Hemoglobin A1c     Status: Abnormal   Collection Time: 09/15/22  8:40 AM  Result Value Ref Range   Hgb A1c MFr Bld 5.7 (H) 4.8 - 5.6 %    Comment:          Prediabetes: 5.7 - 6.4          Diabetes: >6.4          Glycemic control for adults with diabetes: <7.0    Est. average glucose Bld gHb Est-mCnc 117 mg/dL  TSH     Status: None   Collection Time: 09/15/22  8:40 AM  Result Value Ref Range   TSH 1.150 0.450 - 4.500 uIU/mL  CMP14+EGFR     Status: Abnormal   Collection Time: 09/15/22  8:40 AM  Result Value Ref Range   Glucose 89 70 - 99 mg/dL   BUN 11 6 - 24 mg/dL   Creatinine, Ser 1.61 0.57 - 1.00 mg/dL   eGFR 096 >04 VW/UJW/1.19   BUN/Creatinine Ratio 17 9 - 23   Sodium 143 134 -  144 mmol/L   Potassium 4.6 3.5 - 5.2 mmol/L   Chloride 107 (H) 96 - 106 mmol/L   CO2 20 20 - 29 mmol/L   Calcium 9.2 8.7 - 10.2 mg/dL   Total Protein 6.9 6.0 - 8.5 g/dL   Albumin 4.3 3.9 - 4.9 g/dL   Globulin, Total 2.6 1.5 - 4.5 g/dL   Bilirubin Total 0.3 0.0 - 1.2 mg/dL   Alkaline Phosphatase 86 44 - 121 IU/L   AST 17 0 - 40 IU/L   ALT 16 0 - 32 IU/L  Lipid panel     Status: Abnormal   Collection Time: 09/15/22  8:40 AM  Result Value Ref Range   Cholesterol, Total 166 100 - 199 mg/dL   Triglycerides 95 0 - 149 mg/dL   HDL 40 >14 mg/dL   VLDL Cholesterol Cal 18 5 - 40 mg/dL   LDL Chol Calc (NIH) 782 (H) 0 - 99 mg/dL   Chol/HDL Ratio 4.2 0.0 - 4.4 ratio    Comment:                                   T. Chol/HDL Ratio                                             Men  Women                               1/2 Avg.Risk  3.4    3.3                                   Avg.Risk  5.0    4.4  2X Avg.Risk  9.6    7.1                                3X Avg.Risk 23.4   11.0   CBC with Differential/Platelet     Status: None   Collection Time: 09/15/22  8:40 AM  Result Value Ref Range   WBC 7.1 3.4 - 10.8 x10E3/uL   RBC 4.41 3.77 - 5.28 x10E6/uL   Hemoglobin 13.4 11.1 - 15.9 g/dL   Hematocrit 16.1 09.6 - 46.6 %   MCV 93 79 - 97 fL   MCH 30.4 26.6 - 33.0 pg   MCHC 32.5 31.5 - 35.7 g/dL   RDW 04.5 40.9 - 81.1 %   Platelets 318 150 - 450 x10E3/uL   Neutrophils 60 Not Estab. %   Lymphs 30 Not Estab. %   Monocytes 7 Not Estab. %   Eos 2 Not Estab. %   Basos 1 Not Estab. %   Neutrophils Absolute 4.2 1.4 - 7.0 x10E3/uL   Lymphocytes Absolute 2.1 0.7 - 3.1 x10E3/uL   Monocytes Absolute 0.5 0.1 - 0.9 x10E3/uL   EOS (ABSOLUTE) 0.2 0.0 - 0.4 x10E3/uL   Basophils Absolute 0.1 0.0 - 0.2 x10E3/uL   Immature Granulocytes 0 Not Estab. 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  Will defer to them for further treatment.   Relevant Orders   Ambulatory referral to Dermatology       Return in about 3 months (around 01/10/2023).   Total time spent: 30 minutes  Miki Kins, FNP  10/10/2022   This document may have been prepared by [facility] Voice Recognition software and as such may include unintentional dictation errors.

## 2022-12-08 ENCOUNTER — Ambulatory Visit: Payer: No Typology Code available for payment source | Admitting: Dermatology

## 2022-12-08 DIAGNOSIS — D492 Neoplasm of unspecified behavior of bone, soft tissue, and skin: Secondary | ICD-10-CM | POA: Diagnosis not present

## 2022-12-08 DIAGNOSIS — D2239 Melanocytic nevi of other parts of face: Secondary | ICD-10-CM | POA: Diagnosis not present

## 2022-12-08 DIAGNOSIS — Z7189 Other specified counseling: Secondary | ICD-10-CM

## 2022-12-08 DIAGNOSIS — D489 Neoplasm of uncertain behavior, unspecified: Secondary | ICD-10-CM

## 2022-12-08 NOTE — Patient Instructions (Addendum)
Biopsy Wound Care Instructions  Leave the original bandage on for 24 hours if possible.  If the bandage becomes soaked or soiled before that time, it is OK to remove it and examine the wound.  A small amount of post-operative bleeding is normal.  If excessive bleeding occurs, remove the bandage, place gauze over the site and apply continuous pressure (no peeking) over the area for 30 minutes. If this does not work, please call our clinic as soon as possible or page your doctor if it is after hours.   Once a day, cleanse the wound with soap and water. It is fine to shower. If a thick crust develops you may use a Q-tip dipped into dilute hydrogen peroxide (mix 1:1 with water) to dissolve it.  Hydrogen peroxide can slow the healing process, so use it only as needed.    After washing, apply petroleum jelly (Vaseline) or an antibiotic ointment if your doctor prescribed one for you, followed by a bandage.    For best healing, the wound should be covered with a layer of ointment at all times. If you are not able to keep the area covered with a bandage to hold the ointment in place, this may mean re-applying the ointment several times a day.  Continue this wound care until the wound has healed and is no longer open.   Itching and mild discomfort is normal during the healing process. However, if you develop pain or severe itching, please call our office.   If you have any discomfort, you can take Tylenol (acetaminophen) or ibuprofen as directed on the bottle. (Please do not take these if you have an allergy to them or cannot take them for another reason).  Some redness, tenderness and white or yellow material in the wound is normal healing.  If the area becomes very sore and red, or develops a thick yellow-green material (pus), it may be infected; please notify us.    If you have stitches, return to clinic as directed to have the stitches removed. You will continue wound care for 2-3 days after the stitches  are removed.   Wound healing continues for up to one year following surgery. It is not unusual to experience pain in the scar from time to time during the interval.  If the pain becomes severe or the scar thickens, you should notify the office.    A slight amount of redness in a scar is expected for the first six months.  After six months, the redness will fade and the scar will soften and fade.  The color difference becomes less noticeable with time.  If there are any problems, return for a post-op surgery check at your earliest convenience.  To improve the appearance of the scar, you can use silicone scar gel, cream, or sheets (such as Mederma or Serica) every night for up to one year. These are available over the counter (without a prescription).  Please call our office at [phone] for any questions or concerns.      Due to recent changes in healthcare laws, you may see results of your pathology and/or laboratory studies on MyChart before the doctors have had a chance to review them. We understand that in some cases there may be results that are confusing or concerning to you. Please understand that not all results are received at the same time and often the doctors may need to interpret multiple results in order to provide you with the best plan of care or  course of treatment. Therefore, we ask that you please give [place] 2 business days to thoroughly review all your results before contacting the office for clarification. Should we see a critical lab result, you will be contacted sooner.   If You Need Anything After Your Visit  If you have any questions or concerns for your doctor, please call our main line at [phone] and press option 4 to reach your doctor's medical assistant. If no one answers, please leave a voicemail as directed and we will return your call as soon as possible. Messages left after 4 pm will be answered the following business day.   You may also send [place] a message  via MyChart. We typically respond to MyChart messages within 1-2 business days.  For prescription refills, please ask your pharmacy to contact our office. Our fax number is [phone].  If you have an urgent issue when the clinic is closed that cannot wait until the next business day, you can page your doctor at the number below.    Please note that while we do our best to be available for urgent issues outside of office hours, we are not available 24/7.   If you have an urgent issue and are unable to reach [place], you may choose to seek medical care at your doctor's office, retail clinic, urgent care center, or emergency room.  If you have a medical emergency, please immediately call 911 or go to the emergency department.  Pager Numbers  - Dr. Gwen Pounds: [phone]  - Dr. Roseanne Reno: [phone]  - Dr. Katrinka Blazing: [phone]   In the event of inclement weather, please call our main line at [phone] for an update on the status of any delays or closures.  Dermatology Medication Tips: Please keep the boxes that topical medications come in in order to help keep track of the instructions about where and how to use these. Pharmacies typically print the medication instructions only on the boxes and not directly on the medication tubes.   If your medication is too expensive, please contact our office at [phone] option 4 or send [place] a message through MyChart.   We are unable to tell what your co-pay for medications will be in advance as this is different depending on your insurance coverage. However, we may be able to find a substitute medication at lower cost or fill out paperwork to get insurance to cover a needed medication.   If a prior authorization is required to get your medication covered by your insurance company, please allow [place] 1-2 business days to complete this process.  Drug prices often vary depending on where the prescription is filled and some pharmacies may offer cheaper  prices.  The website www.goodrx.com contains coupons for medications through different pharmacies. The prices here do not account for what the cost may be with help from insurance (it may be cheaper with your insurance), but the website can give you the price if you did not use any insurance.  - You can print the associated coupon and take it with your prescription to the pharmacy.  - You may also stop by our office during regular business hours and pick up a GoodRx coupon card.  - If you need your prescription sent electronically to a different pharmacy, notify our office through [facility] or by phone at [phone] option 4.     Si Usted Necesita Algo Despus de Su Visita  Tambin puede enviarnos un mensaje a travs de Clinical cytogeneticist. Por lo general respondemos  a los mensajes de MyChart en el transcurso de 1 a 2 das hbiles.  Para renovar recetas, por favor pida a su farmacia que se ponga en contacto con nuestra oficina. Annie Sable de fax es [place] [phone].  Si tiene un asunto urgente cuando la clnica est cerrada y que no puede esperar hasta el siguiente da hbil, puede llamar/localizar a su doctor(a) al nmero que aparece a continuacin.   Por favor, tenga en cuenta que aunque hacemos todo lo posible para estar disponibles para asuntos urgentes fuera del horario de [place], no estamos disponibles las 24 horas del da, los 7 [address]  [address] de la [place].   Si tiene un problema urgente y no puede comunicarse con nosotros, puede optar por buscar atencin mdica  en el consultorio de su doctor(a), en una clnica privada, en un centro de atencin urgente o en una sala de emergencias.  Si tiene Engineer, drilling, por favor llame inmediatamente al 911 o vaya a la sala de emergencias.  Nmeros de bper  - Dr. Gwen Pounds: [phone]  - Dra. Roseanne Reno: [phone]  - Dr. Katrinka Blazing: [phone]   En caso de inclemencias del tiempo, por favor llame a Lacy Duverney principal al [phone]  para una actualizacin sobre el [place] de cualquier retraso o cierre.  Consejos para la medicacin en dermatologa: Por favor, guarde las cajas en las que vienen los medicamentos de uso tpico para ayudarle a seguir las instrucciones sobre dnde y cmo usarlos. Las farmacias generalmente imprimen las instrucciones del medicamento slo en las cajas y no directamente en los tubos del [place].   Si su medicamento es muy caro, por favor, pngase en contacto con Rolm [place]ala llamando al [phone] y presione la opcin 4 o envenos un mensaje a travs de Clinical cytogeneticist.   No podemos decirle cul ser su copago por los medicamentos por adelantado ya que esto es diferente dependiendo de la cobertura de su seguro. Sin embargo, es posible que podamos encontrar un medicamento sustituto a Audiological scientist un formulario para que el seguro cubra el medicamento que se considera necesario.   Si se requiere una autorizacin previa para que su compaa de seguros [place] su medicamento, por favor permtanos de 1 a 2 das hbiles para completar [address]  Los precios de los medicamentos varan con frecuencia dependiendo del Environmental consultant de dnde se surte la receta y alguna farmacias pueden ofrecer precios ms baratos.  El sitio web www.goodrx.com tiene cupones para medicamentos de Health and safety inspector. Los precios aqu no tienen en cuenta lo que podra costar con la ayuda del seguro (puede ser ms barato con su seguro), pero el sitio web puede darle el precio si no utiliz Tourist information centre manager.  - Puede imprimir el cupn correspondiente y llevarlo con su receta a la farmacia.  - Tambin puede pasar por nuestra oficina durante el horario de atencin regular y Education officer, museum una tarjeta de cupones de GoodRx.  - Si necesita que su receta se enve electrnicamente a una farmacia diferente, informe a nuestra oficina a travs de MyChart de [place] o por telfono llamando al [phone] y presione la opcin 4.

## 2022-12-08 NOTE — Progress Notes (Signed)
   New Patient Visit   Subjective  Linda Simon is a 47 y.o. female who presents for the following: patient is bothered by a spot at right side nose she has had for over a year.   The patient has spots, moles and lesions to be evaluated, some may be new or changing and the patient may have concern these could be cancer.   The following portions of the chart were reviewed this encounter and updated as appropriate: medications, allergies, medical history  Review of Systems:  No other skin or systemic complaints except as noted in HPI or Assessment and Plan.  Objective  Well appearing patient in no apparent distress; mood and affect are within normal limits.    A focused examination was performed of the following areas: Right nose  Relevant exam findings are noted in the Assessment and Plan.  right nose 0.3 cm flesh papule     Assessment & Plan     NEOPLASM OF UNCERTAIN BEHAVIOR right nose Epidermal / dermal shaving  Lesion diameter (cm):  0.3 Informed consent: discussed and consent obtained   Timeout: patient name, date of birth, surgical site, and procedure verified   Procedure prep:  Patient was prepped and draped in usual sterile fashion Prep type:  Isopropyl alcohol Anesthesia: the lesion was anesthetized in a standard fashion   Anesthetic:  1% lidocaine w/ epinephrine 1-100,000 buffered w/ 8.4% NaHCO3 Instrument used: flexible razor blade   Hemostasis achieved with: pressure, aluminum chloride and electrodesiccation   Outcome: patient tolerated procedure well   Post-procedure details: sterile dressing applied and wound care instructions given   Dressing type: bandage and petrolatum   Specimen 1 - Surgical pathology Differential Diagnosis: R/o fibrous papule vs other   Check Margins: No R/o fibrous papule vs other    Return if symptoms worsen or fail to improve.  IAsher Muir, CMA, am acting as scribe for Armida Sans, MD.   Documentation: I have  reviewed the above documentation for accuracy and completeness, and I agree with the above.  Armida Sans, MD

## 2022-12-12 ENCOUNTER — Ambulatory Visit: Payer: No Typology Code available for payment source | Admitting: Dermatology

## 2022-12-15 LAB — SURGICAL PATHOLOGY

## 2022-12-16 ENCOUNTER — Encounter: Payer: Self-pay | Admitting: Dermatology

## 2022-12-19 ENCOUNTER — Telehealth: Payer: Self-pay

## 2022-12-19 NOTE — Telephone Encounter (Addendum)
Tried calling patient regarding bx results using interpretor services. No answer. LM using interpretor.   ----- Message from Armida Sans sent at 12/15/2022  5:35 PM EST ----- FINAL DIAGNOSIS        1. Skin, right nose :       FIBROUS PAPULE   Benign fibrous papule of nose As suspected Sometimes they can recur No further treatment needed at this time

## 2022-12-22 ENCOUNTER — Telehealth: Payer: Self-pay

## 2022-12-22 NOTE — Telephone Encounter (Addendum)
Tried calling patient regarding results using spanish interpretor services. No answer. LM for patient to return call using the interpretor services.   ----- Message from Armida Sans sent at 12/15/2022  5:35 PM EST ----- FINAL DIAGNOSIS        1. Skin, right nose :       FIBROUS PAPULE   Benign fibrous papule of nose As suspected Sometimes they can recur No further treatment needed at this time

## 2023-01-02 ENCOUNTER — Telehealth: Payer: Self-pay

## 2023-01-02 NOTE — Telephone Encounter (Addendum)
Tried calling patient regarding results using interpretor services no answer. LM for patient to return call.   ----- Message from Armida Sans sent at 12/15/2022  5:35 PM EST ----- FINAL DIAGNOSIS        1. Skin, right nose :       FIBROUS PAPULE   Benign fibrous papule of nose As suspected Sometimes they can recur No further treatment needed at this time

## 2023-01-10 ENCOUNTER — Encounter: Payer: Self-pay | Admitting: Family

## 2023-01-10 ENCOUNTER — Ambulatory Visit: Payer: No Typology Code available for payment source | Admitting: Family

## 2023-01-10 ENCOUNTER — Ambulatory Visit
Admission: RE | Admit: 2023-01-10 | Discharge: 2023-01-10 | Disposition: A | Discharge status: Home / Self Care | Payer: No Typology Code available for payment source | Attending: Family | Admitting: Family

## 2023-01-10 ENCOUNTER — Ambulatory Visit
Admission: RE | Admit: 2023-01-10 | Discharge: 2023-01-10 | Disposition: A | Discharge status: Home / Self Care | Payer: No Typology Code available for payment source | Source: Ambulatory Visit | Attending: Family | Admitting: Family

## 2023-01-10 VITALS — BP 126/86 | HR 68 | Ht 60.0 in | Wt 164.4 lb

## 2023-01-10 DIAGNOSIS — E538 Deficiency of other specified B group vitamins: Secondary | ICD-10-CM

## 2023-01-10 DIAGNOSIS — M25571 Pain in right ankle and joints of right foot: Secondary | ICD-10-CM | POA: Diagnosis present

## 2023-01-10 DIAGNOSIS — I10 Essential (primary) hypertension: Secondary | ICD-10-CM | POA: Diagnosis not present

## 2023-01-10 DIAGNOSIS — M25471 Effusion, right ankle: Secondary | ICD-10-CM | POA: Insufficient documentation

## 2023-01-10 DIAGNOSIS — E782 Mixed hyperlipidemia: Secondary | ICD-10-CM

## 2023-01-10 DIAGNOSIS — Z780 Asymptomatic menopausal state: Secondary | ICD-10-CM

## 2023-01-10 DIAGNOSIS — R232 Flushing: Secondary | ICD-10-CM | POA: Diagnosis not present

## 2023-01-10 DIAGNOSIS — R7303 Prediabetes: Secondary | ICD-10-CM

## 2023-01-10 DIAGNOSIS — E559 Vitamin D deficiency, unspecified: Secondary | ICD-10-CM

## 2023-01-10 MED ORDER — CLONIDINE HCL 0.1 MG PO TABS: 0.1000 mg | ORAL_TABLET | Freq: Every day | ORAL | 11 refills | Status: DC

## 2023-01-10 NOTE — Progress Notes (Signed)
 Established Patient Office Visit  Subjective:  Patient ID: Linda Simon, female    DOB: 1975-12-11  Age: 48 y.o. MRN: [id number]  Chief Complaint  Patient presents with   Follow-up    3 month follow up    HPI  No other concerns at this time.   Past Medical History:  Diagnosis Date   Allergic rhinitis    Mixed hyperlipidemia    Prediabetes     History reviewed. No pertinent surgical history.  Social History   Socioeconomic History   Marital status: Married    Spouse name: Not on file   Number of children: Not on file   Years of education: Not on file   Highest education level: Not on file  Occupational History   Not on file  Tobacco Use   Smoking status: Never   Smokeless tobacco: Never  Vaping Use   Vaping status: Never Used  Substance and Sexual Activity   Alcohol use: Never   Drug use: Never   Sexual activity: Yes    Birth control/protection: Pill  Other Topics Concern   Not on file  Social History Narrative   Not on file   Social Drivers of Health   Financial Resource Strain: Not on file  Food Insecurity: Not on file  Transportation Needs: Not on file  Physical Activity: Not on file  Stress: Not on file  Social Connections: Not on file  Intimate Partner Violence: Not on file    Family History  Problem Relation Age of Onset   Diabetes Mother    Breast cancer Neg Hx     No Known Allergies  ROS     Objective:   BP 126/86   Pulse 68   Ht 5' (1.524 m)   Wt 164 lb 6.4 oz (74.6 kg)   SpO2 98%   BMI 32.11 kg/m   Vitals:   01/10/23 1522  BP: 126/86  Pulse: 68  Height: 5' (1.524 m)  Weight: 164 lb 6.4 oz (74.6 kg)  SpO2: 98%  BMI (Calculated): 32.11    Physical Exam   Results for orders placed or performed in visit on 01/10/23  Lipid panel  Result Value Ref Range   Cholesterol, Total 185 100 - 199 mg/dL   Triglycerides 96 0 - 149 mg/dL   HDL 49 >60 mg/dL   VLDL Cholesterol Cal 17 5 - 40 mg/dL   LDL Chol Calc (NIH) 880  (H) 0 - 99 mg/dL   Chol/HDL Ratio 3.8 0.0 - 4.4 ratio  VITAMIN D  25 Hydroxy (Vit-D Deficiency, Fractures)  Result Value Ref Range   Vit D, 25-Hydroxy 46.9 30.0 - 100.0 ng/mL  CMP14+EGFR  Result Value Ref Range   Glucose 95 70 - 99 mg/dL   BUN 13 6 - 24 mg/dL   Creatinine, Ser 9.29 0.57 - 1.00 mg/dL   eGFR 890 >40 fO/fpw/8.26   BUN/Creatinine Ratio 19 9 - 23   Sodium 139 134 - 144 mmol/L   Potassium 4.3 3.5 - 5.2 mmol/L   Chloride 104 96 - 106 mmol/L   CO2 23 20 - 29 mmol/L   Calcium 9.7 8.7 - 10.2 mg/dL   Total Protein 7.0 6.0 - 8.5 g/dL   Albumin 4.3 3.9 - 4.9 g/dL   Globulin, Total 2.7 1.5 - 4.5 g/dL   Bilirubin Total 0.3 0.0 - 1.2 mg/dL   Alkaline Phosphatase 84 44 - 121 IU/L   AST 12 0 - 40 IU/L   ALT 15 0 - 32  IU/L  TSH  Result Value Ref Range   TSH 1.590 0.450 - 4.500 uIU/mL  Hemoglobin A1c  Result Value Ref Range   Hgb A1c MFr Bld 5.8 (H) 4.8 - 5.6 %   Est. average glucose Bld gHb Est-mCnc 120 mg/dL  Vitamin B12  Result Value Ref Range   Vitamin B-12 1,954 (H) 232 - 1,245 pg/mL  CBC with Diff  Result Value Ref Range   WBC 10.2 3.4 - 10.8 x10E3/uL   RBC 4.46 3.77 - 5.28 x10E6/uL   Hemoglobin 13.4 11.1 - 15.9 g/dL   Hematocrit 58.3 65.9 - 46.6 %   MCV 93 79 - 97 fL   MCH 30.0 26.6 - 33.0 pg   MCHC 32.2 31.5 - 35.7 g/dL   RDW 87.4 88.2 - 84.5 %   Platelets 305 150 - 450 x10E3/uL   Neutrophils 59 Not Estab. %   Lymphs 31 Not Estab. %   Monocytes 7 Not Estab. %   Eos 2 Not Estab. %   Basos 1 Not Estab. %   Neutrophils Absolute 6.0 1.4 - 7.0 x10E3/uL   Lymphocytes Absolute 3.2 (H) 0.7 - 3.1 x10E3/uL   Monocytes Absolute 0.7 0.1 - 0.9 x10E3/uL   EOS (ABSOLUTE) 0.2 0.0 - 0.4 x10E3/uL   Basophils Absolute 0.1 0.0 - 0.2 x10E3/uL   Immature Granulocytes 0 Not Estab. %   Immature Grans (Abs) 0.0 0.0 - 0.1 x10E3/uL  FSH+Prog+E2+SHBG  Result Value Ref Range   FSH 4.3 mIU/mL   Progesterone 0.1 ng/mL   Sex Hormone Binding 127.0 (H) 24.6 - 122.0 nmol/L   Estradiol  39.2 pg/mL    Recent Results (from the past 2160 hours)  Lipid panel     Status: Abnormal   Collection Time: 01/10/23  3:47 PM  Result Value Ref Range   Cholesterol, Total 185 100 - 199 mg/dL   Triglycerides 96 0 - 149 mg/dL   HDL 49 >60 mg/dL   VLDL Cholesterol Cal 17 5 - 40 mg/dL   LDL Chol Calc (NIH) 880 (H) 0 - 99 mg/dL   Chol/HDL Ratio 3.8 0.0 - 4.4 ratio    Comment:                                   T. Chol/HDL Ratio                                             Men  Women                               1/2 Avg.Risk  3.4    3.3                                   Avg.Risk  5.0    4.4                                2X Avg.Risk  9.6    7.1  3X Avg.Risk 23.4   11.0   VITAMIN D  25 Hydroxy (Vit-D Deficiency, Fractures)     Status: None   Collection Time: 01/10/23  3:47 PM  Result Value Ref Range   Vit D, 25-Hydroxy 46.9 30.0 - 100.0 ng/mL    Comment: Vitamin D  deficiency has been defined by the Institute of Medicine and an Endocrine Society practice guideline as a level of serum 25-OH vitamin D  less than 20 ng/mL (1,2). The Endocrine Society went on to further define vitamin D  insufficiency as a level between 21 and 29 ng/mL (2). 1. IOM (Institute of Medicine). 2010. Dietary reference    intakes for calcium and D. [place]: The    [employer]. 2. Holick MF, Binkley [place], Bischoff-Ferrari HA, et al.    Evaluation, treatment, and prevention of vitamin D     deficiency: an Endocrine Society clinical practice    guideline. JCEM. 2011 Jul; 96(7):1911-30.   CMP14+EGFR     Status: None   Collection Time: 01/10/23  3:47 PM  Result Value Ref Range   Glucose 95 70 - 99 mg/dL   BUN 13 6 - 24 mg/dL   Creatinine, Ser 9.29 0.57 - 1.00 mg/dL   eGFR 890 >40 fO/fpw/8.26   BUN/Creatinine Ratio 19 9 - 23   Sodium 139 134 - 144 mmol/L   Potassium 4.3 3.5 - 5.2 mmol/L   Chloride 104 96 - 106 mmol/L   CO2 23 20 - 29 mmol/L   Calcium 9.7 8.7 - 10.2  mg/dL   Total Protein 7.0 6.0 - 8.5 g/dL   Albumin 4.3 3.9 - 4.9 g/dL   Globulin, Total 2.7 1.5 - 4.5 g/dL   Bilirubin Total 0.3 0.0 - 1.2 mg/dL   Alkaline Phosphatase 84 44 - 121 IU/L   AST 12 0 - 40 IU/L   ALT 15 0 - 32 IU/L  TSH     Status: None   Collection Time: 01/10/23  3:47 PM  Result Value Ref Range   TSH 1.590 0.450 - 4.500 uIU/mL  Hemoglobin A1c     Status: Abnormal   Collection Time: 01/10/23  3:47 PM  Result Value Ref Range   Hgb A1c MFr Bld 5.8 (H) 4.8 - 5.6 %    Comment:          Prediabetes: 5.7 - 6.4          Diabetes: >6.4          Glycemic control for adults with diabetes: <7.0    Est. average glucose Bld gHb Est-mCnc 120 mg/dL  Vitamin B12     Status: Abnormal   Collection Time: 01/10/23  3:47 PM  Result Value Ref Range   Vitamin B-12 1,954 (H) 232 - 1,245 pg/mL  CBC with Diff     Status: Abnormal   Collection Time: 01/10/23  3:47 PM  Result Value Ref Range   WBC 10.2 3.4 - 10.8 x10E3/uL   RBC 4.46 3.77 - 5.28 x10E6/uL   Hemoglobin 13.4 11.1 - 15.9 g/dL   Hematocrit 58.3 65.9 - 46.6 %   MCV 93 79 - 97 fL   MCH 30.0 26.6 - 33.0 pg   MCHC 32.2 31.5 - 35.7 g/dL   RDW 87.4 88.2 - 84.5 %   Platelets 305 150 - 450 x10E3/uL   Neutrophils 59 Not Estab. %   Lymphs 31 Not Estab. %   Monocytes 7 Not Estab. %   Eos 2 Not Estab. %   Basos 1 Not Estab. %  Neutrophils Absolute 6.0 1.4 - 7.0 x10E3/uL   Lymphocytes Absolute 3.2 (H) 0.7 - 3.1 x10E3/uL   Monocytes Absolute 0.7 0.1 - 0.9 x10E3/uL   EOS (ABSOLUTE) 0.2 0.0 - 0.4 x10E3/uL   Basophils Absolute 0.1 0.0 - 0.2 x10E3/uL   Immature Granulocytes 0 Not Estab. %   Immature Grans (Abs) 0.0 0.0 - 0.1 x10E3/uL  FSH+Prog+E2+SHBG     Status: Abnormal   Collection Time: 01/10/23  3:47 PM  Result Value Ref Range   FSH 4.3 mIU/mL    Comment:                      Adult Female             Range                       Follicular phase      3.5 -  12.5                       Ovulation phase       4.7 -  21.5                        Luteal phase          1.7 -   7.7                       Postmenopausal       25.8 - 134.8    Progesterone 0.1 ng/mL    Comment:                      Follicular phase       0.1 -   0.9                      Luteal phase           1.8 -  23.9                      Ovulation phase        0.1 -  12.0                      Pregnant                         First trimester    11.0 -  44.3                         Second trimester   25.4 -  83.3                         Third trimester    58.7 - 214.0                      Postmenopausal         0.0 -   0.1    Sex Hormone Binding 127.0 (H) 24.6 - 122.0 nmol/L   Estradiol 39.2 pg/mL    Comment:                      Adult Female             Range  Follicular phase     12.5 - 166.0                       Ovulation phase      85.8 - 498.0                       Luteal phase         43.8 - 211.0                       Postmenopausal       <6.0 -  54.7                      Pregnancy                       1st trimester     215.0 - >4300.0 Roche ECLIA methodology        Assessment & Plan:   Problem List Items Addressed This Visit       Other   Mixed hyperlipidemia   Checking labs today.  Continue current therapy for lipid control. Will modify as needed based on labwork results.        Relevant Medications   cloNIDine  (CATAPRES ) 0.1 MG tablet   Other Relevant Orders   Lipid panel (Completed)   CMP14+EGFR (Completed)   CBC with Diff (Completed)   Prediabetes   A1C Continues to be in prediabetic ranges.  Will reassess at follow up after next lab check.  Patient counseled on dietary choices and verbalized understanding.  Patient educated on foods that contain carbohydrates and the need to decrease intake.  We discussed prediabetes, and what it means and the need for strict dietary control to prevent progression to type 2 diabetes.  Advised to decrease intake of sugary drinks, including sodas, sweet tea, and some  juices, and of starch and sugar heavy foods (ie., potatoes, rice, bread, pasta, desserts). She verbalizes understanding and agreement with the changes discussed today.        Relevant Orders   CMP14+EGFR (Completed)   Hemoglobin A1c (Completed)   CBC with Diff (Completed)   Other Visit Diagnoses       Pain and swelling of right ankle    -  Primary   X-ray of right ankle. Will call patient with results when available. In the meantime suggest RICE   Relevant Orders   DG Ankle Complete Right (Completed)   CMP14+EGFR (Completed)   CBC with Diff (Completed)     Menopause       Checking hormone levels today to see if patient is indeed in menopause. Will call patient with results when available   Relevant Orders   CMP14+EGFR (Completed)   CBC with Diff (Completed)   FSH+Prog+E2+SHBG (Completed)     Hot flashes       Clonidine  prescription for patient today. She will call let me know if this is not effective for her   Relevant Medications   cloNIDine  (CATAPRES ) 0.1 MG tablet   Other Relevant Orders   CMP14+EGFR (Completed)   TSH (Completed)   CBC with Diff (Completed)   FSH+Prog+E2+SHBG (Completed)     Vitamin D  deficiency, unspecified       Checking labs today.  Will continue supplements as needed.   Relevant Orders   VITAMIN D  25 Hydroxy (Vit-D Deficiency, Fractures) (Completed)   CMP14+EGFR (Completed)   CBC with Diff (  Completed)     B12 deficiency due to diet       Checking labs today.  Will continue supplements as needed.   Relevant Orders   CMP14+EGFR (Completed)   Vitamin B12 (Completed)   CBC with Diff (Completed)     Essential hypertension, benign       Blood pressure well controlled with current medications.  Continue current therapy.  Will reassess at follow up.   Relevant Medications   cloNIDine  (CATAPRES ) 0.1 MG tablet   Other Relevant Orders   CMP14+EGFR (Completed)   CBC with Diff (Completed)       Return in about 3 months (around 04/10/2023) for  F/U.   Total time spent: 30 minutes  ALAN CHRISTELLA ARRANT, FNP  01/10/2023   This document may have been prepared by [facility] Voice Recognition software and as such may include unintentional dictation errors.

## 2023-01-11 LAB — CMP14+EGFR
ALT: 15 [IU]/L (ref 0–32)
AST: 12 [IU]/L (ref 0–40)
Albumin: 4.3 g/dL (ref 3.9–4.9)
Alkaline Phosphatase: 84 [IU]/L (ref 44–121)
BUN/Creatinine Ratio: 19 (ref 9–23)
BUN: 13 mg/dL (ref 6–24)
Bilirubin Total: 0.3 mg/dL (ref 0.0–1.2)
CO2: 23 mmol/L (ref 20–29)
Calcium: 9.7 mg/dL (ref 8.7–10.2)
Chloride: 104 mmol/L (ref 96–106)
Creatinine, Ser: 0.7 mg/dL (ref 0.57–1.00)
Globulin, Total: 2.7 g/dL (ref 1.5–4.5)
Glucose: 95 mg/dL (ref 70–99)
Potassium: 4.3 mmol/L (ref 3.5–5.2)
Sodium: 139 mmol/L (ref 134–144)
Total Protein: 7 g/dL (ref 6.0–8.5)
eGFR: 109 mL/min/{1.73_m2} (ref >59)

## 2023-01-11 LAB — CBC WITH DIFFERENTIAL/PLATELET
Basophils Absolute: 0.1 10*3/uL (ref 0.0–0.2)
Basos: 1 %
EOS (ABSOLUTE): 0.2 10*3/uL (ref 0.0–0.4)
Eos: 2 %
Hematocrit: 41.6 % (ref 34.0–46.6)
Hemoglobin: 13.4 g/dL (ref 11.1–15.9)
Immature Grans (Abs): 0 10*3/uL (ref 0.0–0.1)
Immature Granulocytes: 0 %
Lymphocytes Absolute: 3.2 10*3/uL — ABNORMAL HIGH (ref 0.7–3.1)
Lymphs: 31 %
MCH: 30 pg (ref 26.6–33.0)
MCHC: 32.2 g/dL (ref 31.5–35.7)
MCV: 93 fL (ref 79–97)
Monocytes Absolute: 0.7 10*3/uL (ref 0.1–0.9)
Monocytes: 7 %
Neutrophils Absolute: 6 10*3/uL (ref 1.4–7.0)
Neutrophils: 59 %
Platelets: 305 10*3/uL (ref 150–450)
RBC: 4.46 x10E6/uL (ref 3.77–5.28)
RDW: 12.5 % (ref 11.7–15.4)
WBC: 10.2 10*3/uL (ref 3.4–10.8)

## 2023-01-11 LAB — VITAMIN D 25 HYDROXY (VIT D DEFICIENCY, FRACTURES): Vit D, 25-Hydroxy: 46.9 ng/mL (ref 30.0–100.0)

## 2023-01-11 LAB — LIPID PANEL
Chol/HDL Ratio: 3.8 {ratio} (ref 0.0–4.4)
Cholesterol, Total: 185 mg/dL (ref 100–199)
HDL: 49 mg/dL (ref >39)
LDL Chol Calc (NIH): 119 mg/dL — ABNORMAL HIGH (ref 0–99)
Triglycerides: 96 mg/dL (ref 0–149)
VLDL Cholesterol Cal: 17 mg/dL (ref 5–40)

## 2023-01-11 LAB — FSH+PROG+E2+SHBG
Estradiol: 39.2 pg/mL
FSH: 4.3 m[IU]/mL
Progesterone: 0.1 ng/mL
Sex Hormone Binding: 127 nmol/L — ABNORMAL HIGH (ref 24.6–122.0)

## 2023-01-11 LAB — HEMOGLOBIN A1C
Est. average glucose Bld gHb Est-mCnc: 120 mg/dL
Hgb A1c MFr Bld: 5.8 % — ABNORMAL HIGH (ref 4.8–5.6)

## 2023-01-11 LAB — VITAMIN B12: Vitamin B-12: 1954 pg/mL — ABNORMAL HIGH (ref 232–1245)

## 2023-01-11 LAB — TSH: TSH: 1.59 u[IU]/mL (ref 0.450–4.500)

## 2023-01-17 ENCOUNTER — Telehealth: Payer: Self-pay

## 2023-01-17 NOTE — Telephone Encounter (Addendum)
 Several attempts made to contact patient regarding bx results. Letter mailed today to patient regarding results. No further treatment needed.    ----- Message from Alm Rhyme sent at 12/15/2022  5:35 PM EST ----- FINAL DIAGNOSIS        1. Skin, right nose :       FIBROUS PAPULE   Benign fibrous papule of nose As suspected Sometimes they can recur No further treatment needed at this time

## 2023-01-31 ENCOUNTER — Telehealth: Payer: Self-pay | Admitting: Family

## 2023-01-31 NOTE — Telephone Encounter (Signed)
Patient left VM wanting lab results. Please advise and forward them to [facility].

## 2023-02-07 IMAGING — MG MM DIGITAL SCREENING BILAT W/ TOMO AND CAD
8 series · 8 of 24 positions shown · non-contrast
Comparison: Previous exam(s).

CLINICAL DATA: Screening.

EXAM:
DIGITAL SCREENING BILATERAL MAMMOGRAM WITH TOMOSYNTHESIS AND CAD
TECHNIQUE: Bilateral screening digital craniocaudal and mediolateral oblique
mammograms were obtained. Bilateral screening digital breast
tomosynthesis was performed. The images were evaluated with
computer-aided detection.

[R CC synth-2D]
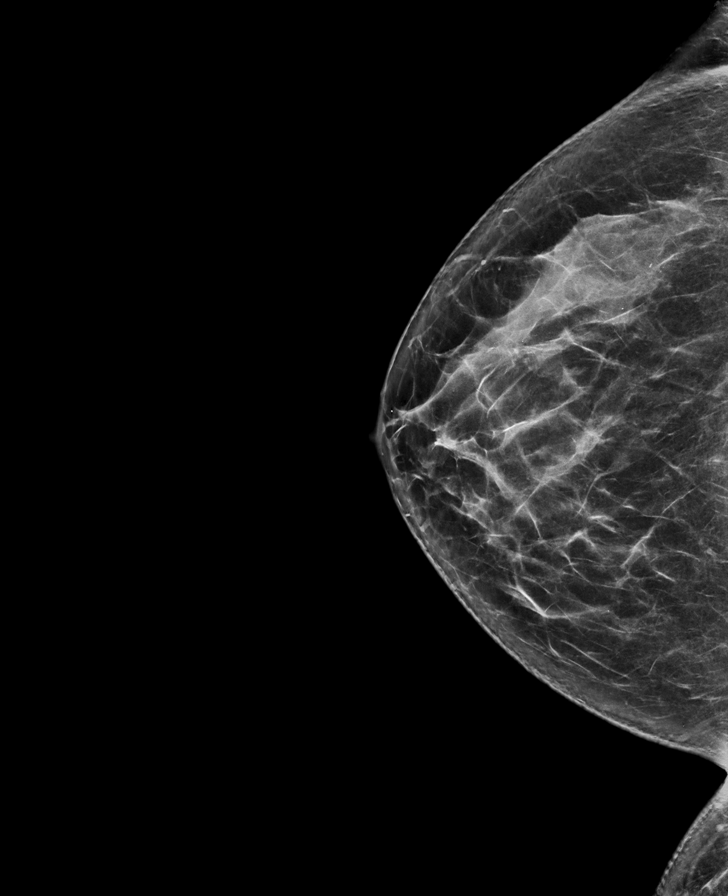

[L CC synth-2D]
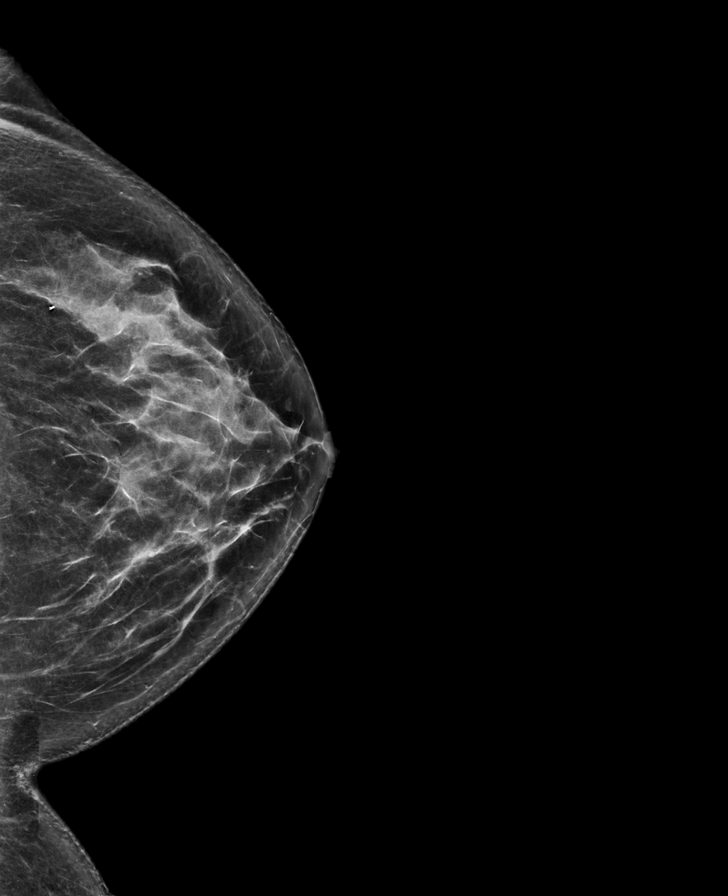

[L MLO synth-2D]
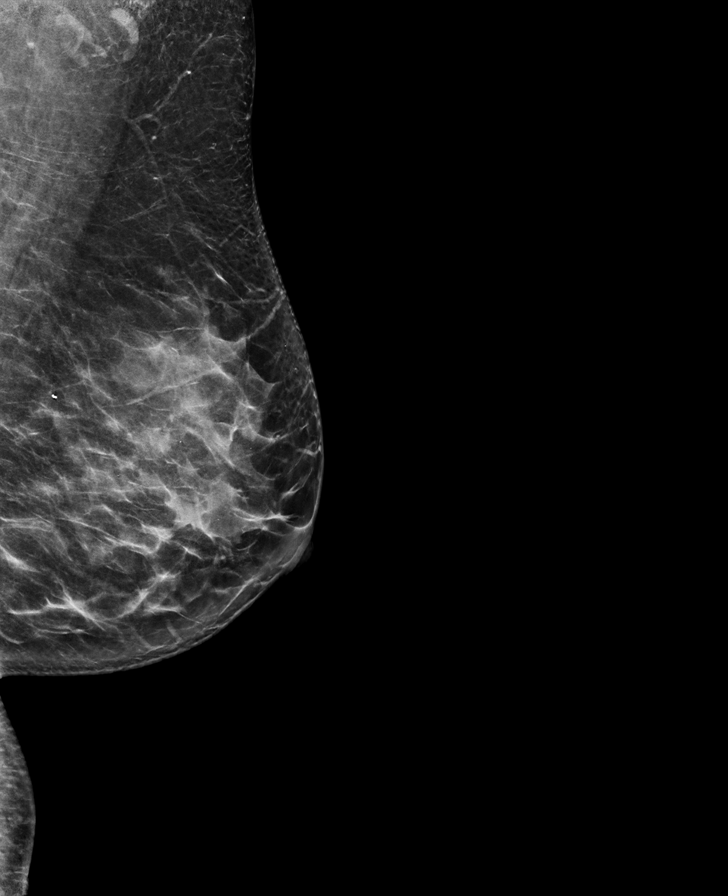

[R MLO synth-2D]
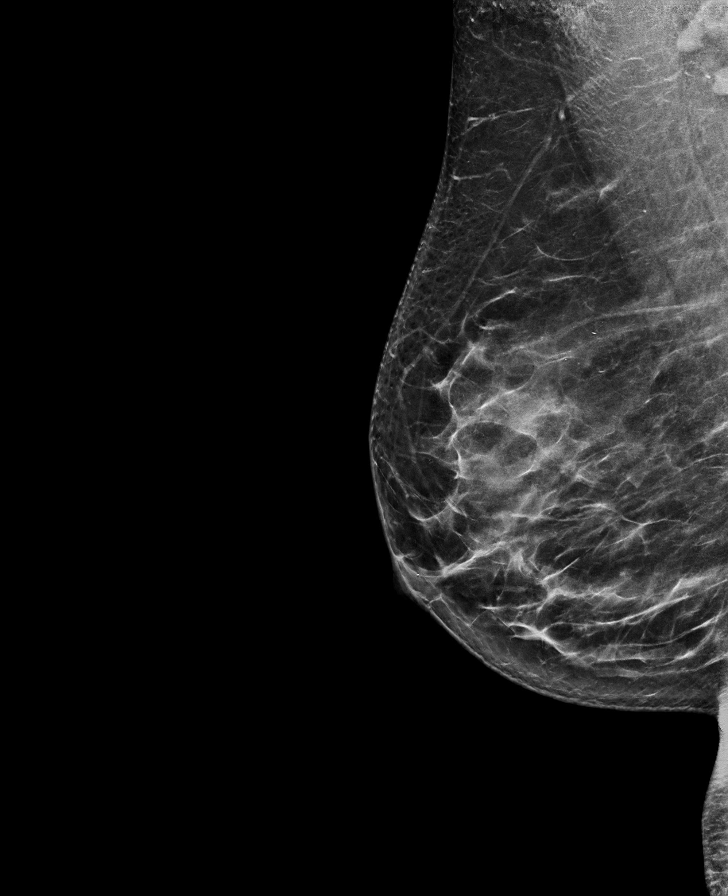

[R MLO tomo · tomo slice 40/79.0]
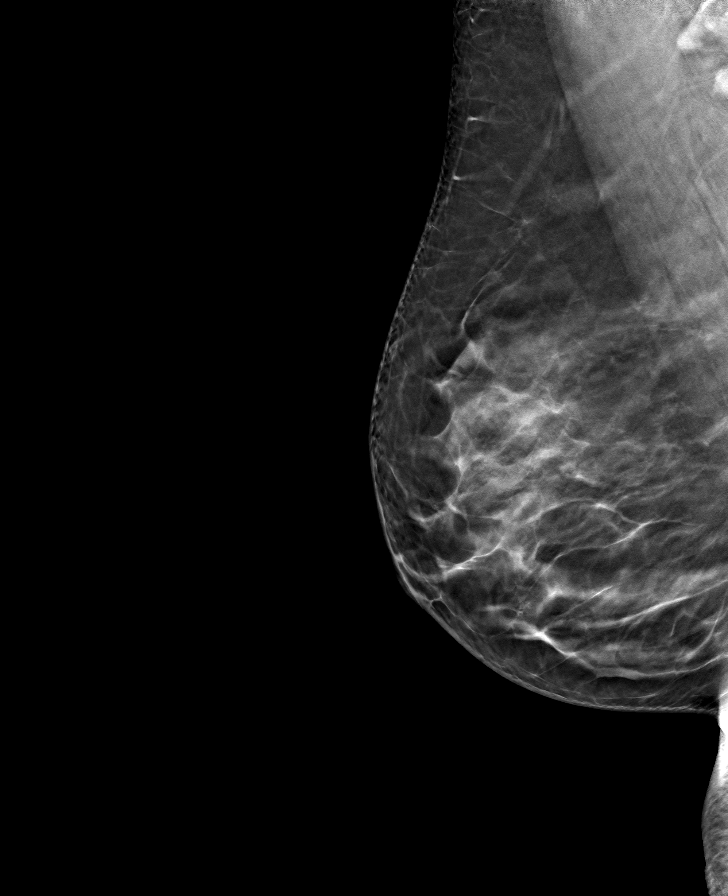

[L MLO tomo · tomo slice 39/76.0]
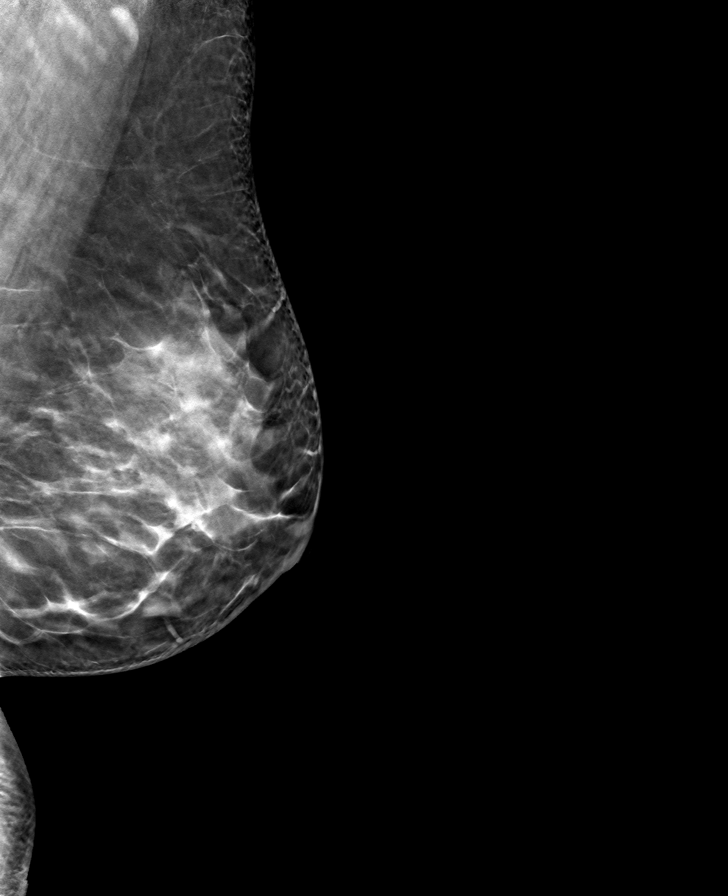

[R CC tomo · tomo slice 39/77.0]
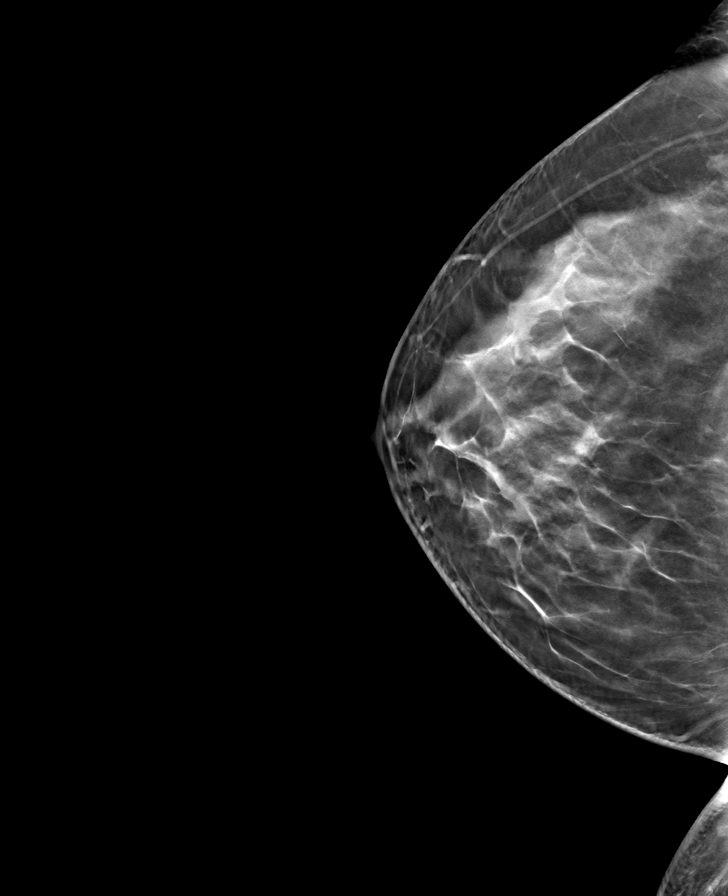

[L CC tomo · tomo slice 39/76.0]
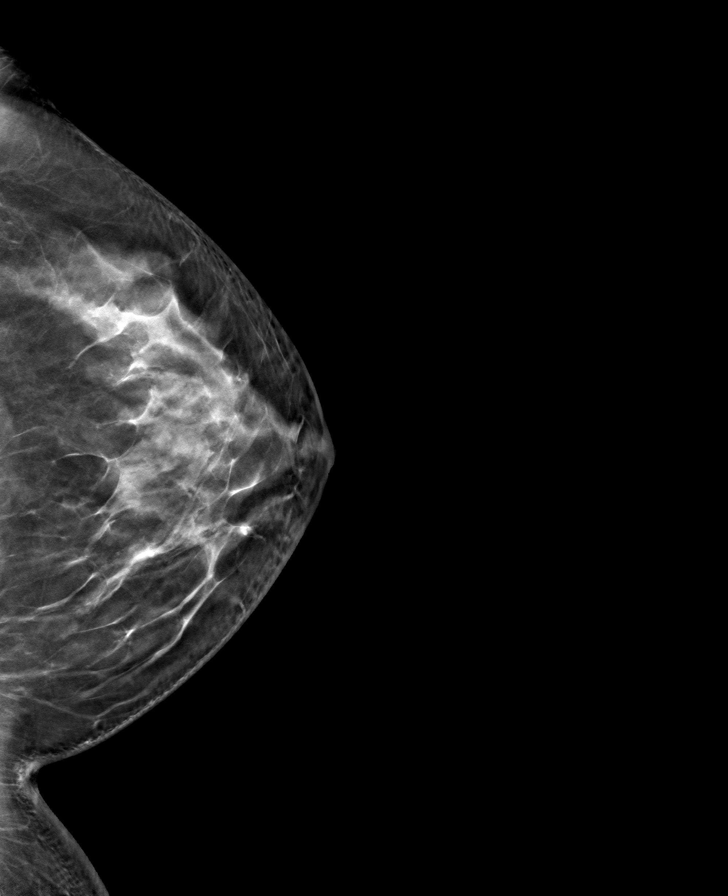

[8 of 24 positions shown; findings below may reference images not displayed]

ACR Breast Density Category c: The breast tissue is heterogeneously
dense, which may obscure small masses.
FINDINGS: There are no findings suspicious for malignancy.
IMPRESSION: No mammographic evidence of malignancy. A result letter of this
screening mammogram will be mailed directly to the patient.

RECOMMENDATION:
Screening mammogram in one year. (Code:Q3-[id number])

BI-RADS CATEGORY  1: Negative.

## 2023-03-10 NOTE — Assessment & Plan Note (Signed)
 Checking labs today.  Continue current therapy for lipid control. Will modify as needed based on labwork results.

## 2023-03-10 NOTE — Assessment & Plan Note (Signed)

## 2023-04-04 ENCOUNTER — Other Ambulatory Visit

## 2023-04-04 DIAGNOSIS — R7303 Prediabetes: Secondary | ICD-10-CM

## 2023-04-04 DIAGNOSIS — I10 Essential (primary) hypertension: Secondary | ICD-10-CM

## 2023-04-04 DIAGNOSIS — E782 Mixed hyperlipidemia: Secondary | ICD-10-CM

## 2023-04-04 DIAGNOSIS — E559 Vitamin D deficiency, unspecified: Secondary | ICD-10-CM

## 2023-04-04 DIAGNOSIS — E538 Deficiency of other specified B group vitamins: Secondary | ICD-10-CM

## 2023-04-04 DIAGNOSIS — Z1329 Encounter for screening for other suspected endocrine disorder: Secondary | ICD-10-CM

## 2023-04-05 LAB — CMP14+EGFR
ALT: 23 IU/L (ref 0–32)
AST: 26 IU/L (ref 0–40)
Albumin: 4.2 g/dL (ref 3.9–4.9)
Alkaline Phosphatase: 78 IU/L (ref 44–121)
BUN/Creatinine Ratio: 15 (ref 9–23)
BUN: 11 mg/dL (ref 6–24)
Bilirubin Total: 0.4 mg/dL (ref 0.0–1.2)
CO2: 23 mmol/L (ref 20–29)
Calcium: 9.2 mg/dL (ref 8.7–10.2)
Chloride: 106 mmol/L (ref 96–106)
Creatinine, Ser: 0.74 mg/dL (ref 0.57–1.00)
Globulin, Total: 2.6 g/dL (ref 1.5–4.5)
Glucose: 94 mg/dL (ref 70–99)
Potassium: 4.7 mmol/L (ref 3.5–5.2)
Sodium: 138 mmol/L (ref 134–144)
Total Protein: 6.8 g/dL (ref 6.0–8.5)
eGFR: 102 mL/min/{1.73_m2} (ref >59)

## 2023-04-05 LAB — TSH: TSH: 1.9 u[IU]/mL (ref 0.450–4.500)

## 2023-04-05 LAB — CBC WITH DIFFERENTIAL/PLATELET
Basophils Absolute: 0.1 10*3/uL (ref 0.0–0.2)
Basos: 1 %
EOS (ABSOLUTE): 0.3 10*3/uL (ref 0.0–0.4)
Eos: 4 %
Hematocrit: 41.9 % (ref 34.0–46.6)
Hemoglobin: 13.5 g/dL (ref 11.1–15.9)
Immature Grans (Abs): 0 10*3/uL (ref 0.0–0.1)
Immature Granulocytes: 0 %
Lymphocytes Absolute: 2.5 10*3/uL (ref 0.7–3.1)
Lymphs: 36 %
MCH: 29.9 pg (ref 26.6–33.0)
MCHC: 32.2 g/dL (ref 31.5–35.7)
MCV: 93 fL (ref 79–97)
Monocytes Absolute: 0.6 10*3/uL (ref 0.1–0.9)
Monocytes: 9 %
Neutrophils Absolute: 3.5 10*3/uL (ref 1.4–7.0)
Neutrophils: 50 %
Platelets: 287 10*3/uL (ref 150–450)
RBC: 4.52 x10E6/uL (ref 3.77–5.28)
RDW: 12.8 % (ref 11.7–15.4)
WBC: 7 10*3/uL (ref 3.4–10.8)

## 2023-04-05 LAB — VITAMIN B12: Vitamin B-12: 537 pg/mL (ref 232–1245)

## 2023-04-05 LAB — VITAMIN D 25 HYDROXY (VIT D DEFICIENCY, FRACTURES): Vit D, 25-Hydroxy: 35.4 ng/mL (ref 30.0–100.0)

## 2023-04-05 LAB — LIPID PANEL
Chol/HDL Ratio: 3.5 ratio (ref 0.0–4.4)
Cholesterol, Total: 157 mg/dL (ref 100–199)
HDL: 45 mg/dL (ref >39)
LDL Chol Calc (NIH): 93 mg/dL (ref 0–99)
Triglycerides: 101 mg/dL (ref 0–149)
VLDL Cholesterol Cal: 19 mg/dL (ref 5–40)

## 2023-04-05 LAB — HEMOGLOBIN A1C
Est. average glucose Bld gHb Est-mCnc: 117 mg/dL
Hgb A1c MFr Bld: 5.7 % — ABNORMAL HIGH (ref 4.8–5.6)

## 2023-04-10 ENCOUNTER — Ambulatory Visit: Payer: No Typology Code available for payment source | Admitting: Family

## 2023-04-10 ENCOUNTER — Encounter: Payer: Self-pay | Admitting: Family

## 2023-04-10 VITALS — BP 114/84 | HR 73 | Ht 60.0 in | Wt 158.0 lb

## 2023-04-10 DIAGNOSIS — J301 Allergic rhinitis due to pollen: Secondary | ICD-10-CM | POA: Diagnosis not present

## 2023-04-10 DIAGNOSIS — E538 Deficiency of other specified B group vitamins: Secondary | ICD-10-CM

## 2023-04-10 DIAGNOSIS — E782 Mixed hyperlipidemia: Secondary | ICD-10-CM | POA: Diagnosis not present

## 2023-04-10 DIAGNOSIS — F5101 Primary insomnia: Secondary | ICD-10-CM

## 2023-04-10 DIAGNOSIS — R7303 Prediabetes: Secondary | ICD-10-CM | POA: Diagnosis not present

## 2023-04-10 DIAGNOSIS — Z0130 Encounter for examination of blood pressure without abnormal findings: Secondary | ICD-10-CM

## 2023-04-10 MED ORDER — TRAZODONE HCL 50 MG PO TABS: 50.0000 mg | ORAL_TABLET | Freq: Every evening | ORAL | 2 refills | Status: DC | PRN

## 2023-04-10 NOTE — Progress Notes (Signed)
 Established Patient Office Visit  Subjective:  Patient ID: Linda Simon, female    DOB: [birth year]  Age: 48 y.o. MRN: [id number]  Chief Complaint  Patient presents with   Follow-up    3 month follow up    Patient is here today for her 3 months follow up.  She has been feeling well since last appointment.   She does have additional concerns to discuss today.  Labs were done last week, and we will review these in detail today. She needs refills.   I have reviewed her active problem list, medication list, allergies, notes from last encounter, lab results for her appointment today.      No other concerns at this time.   Past Medical History:  Diagnosis Date   Allergic rhinitis    Mixed hyperlipidemia    Prediabetes     History reviewed. No pertinent surgical history.  Social History   Socioeconomic History   Marital status: Married    Spouse name: Not on file   Number of children: Not on file   Years of education: Not on file   Highest education level: Not on file  Occupational History   Not on file  Tobacco Use   Smoking status: Never   Smokeless tobacco: Never  Vaping Use   Vaping status: Never Used  Substance and Sexual Activity   Alcohol use: Never   Drug use: Never   Sexual activity: Yes    Birth control/protection: Pill  Other Topics Concern   Not on file  Social History Narrative   Not on file   Social Drivers of Health   Financial Resource Strain: Not on file  Food Insecurity: Not on file  Transportation Needs: Not on file  Physical Activity: Not on file  Stress: Not on file  Social Connections: Not on file  Intimate Partner Violence: Not on file    Family History  Problem Relation Age of Onset   Diabetes Mother    Breast cancer Neg Hx     No Known Allergies  Review of Systems  All other systems reviewed and are negative.      Objective:   BP 114/84   Pulse 73   Ht 5' (1.524 m)   Wt 158 lb (71.7 kg)   SpO2 97%   BMI  30.86 kg/m   Vitals:   04/10/23 1522  BP: 114/84  Pulse: 73  Height: 5' (1.524 m)  Weight: 158 lb (71.7 kg)  SpO2: 97%  BMI (Calculated): 30.86    Physical Exam Vitals and nursing note reviewed.  Constitutional:      Appearance: Normal appearance. She is normal weight.  HENT:     Head: Normocephalic.  Eyes:     Extraocular Movements: Extraocular movements intact.     Conjunctiva/sclera: Conjunctivae normal.     Pupils: Pupils are equal, round, and reactive to light.  Cardiovascular:     Rate and Rhythm: Normal rate.  Pulmonary:     Effort: Pulmonary effort is normal.  Neurological:     General: No focal deficit present.     Mental Status: She is alert and oriented to person, place, and time. Mental status is at baseline.  Psychiatric:        Mood and Affect: Mood normal.        Behavior: Behavior normal.        Thought Content: Thought content normal.      No results found for any visits on 04/10/23.  Recent Results (  from the past 2160 hours)  Lipid panel     Status: None   Collection Time: 04/04/23  8:44 AM  Result Value Ref Range   Cholesterol, Total 157 100 - 199 mg/dL   Triglycerides 045 0 - 149 mg/dL   HDL 45 >40 mg/dL   VLDL Cholesterol Cal 19 5 - 40 mg/dL   LDL Chol Calc (NIH) 93 0 - 99 mg/dL   Chol/HDL Ratio 3.5 0.0 - 4.4 ratio    Comment:                                   T. Chol/HDL Ratio                                             Men  Women                               1/2 Avg.Risk  3.4    3.3                                   Avg.Risk  5.0    4.4                                2X Avg.Risk  9.6    7.1                                3X Avg.Risk 23.4   11.0   Vitamin D  (25 hydroxy)     Status: None   Collection Time: 04/04/23  8:44 AM  Result Value Ref Range   Vit D, 25-Hydroxy 35.4 30.0 - 100.0 ng/mL    Comment: Vitamin D  deficiency has been defined by the Institute of Medicine and an Endocrine Society practice guideline as a level of serum  25-OH vitamin D  less than 20 ng/mL (1,2). The Endocrine Society went on to further define vitamin D  insufficiency as a level between 21 and 29 ng/mL (2). 1. IOM (Institute of Medicine). 2010. Dietary reference    intakes for calcium and D. [place]: The    [employer]. 2. Holick MF, Binkley [place], Bischoff-Ferrari HA, et al.    Evaluation, treatment, and prevention of vitamin D     deficiency: an Endocrine Society clinical practice    guideline. JCEM. 2011 Jul; 96(7):1911-30.   CMP14+EGFR     Status: None   Collection Time: 04/04/23  8:44 AM  Result Value Ref Range   Glucose 94 70 - 99 mg/dL   BUN 11 6 - 24 mg/dL   Creatinine, Ser 9.81 0.57 - 1.00 mg/dL   eGFR 191 >47 WG/NFA/2.13   BUN/Creatinine Ratio 15 9 - 23   Sodium 138 134 - 144 mmol/L   Potassium 4.7 3.5 - 5.2 mmol/L   Chloride 106 96 - 106 mmol/L   CO2 23 20 - 29 mmol/L   Calcium 9.2 8.7 - 10.2 mg/dL   Total Protein 6.8 6.0 - 8.5 g/dL   Albumin 4.2 3.9 - 4.9 g/dL   Globulin, Total 2.6 1.5 - 4.5  g/dL   Bilirubin Total 0.4 0.0 - 1.2 mg/dL   Alkaline Phosphatase 78 44 - 121 IU/L   AST 26 0 - 40 IU/L   ALT 23 0 - 32 IU/L  TSH     Status: None   Collection Time: 04/04/23  8:44 AM  Result Value Ref Range   TSH 1.900 0.450 - 4.500 uIU/mL  Hemoglobin A1c     Status: Abnormal   Collection Time: 04/04/23  8:44 AM  Result Value Ref Range   Hgb A1c MFr Bld 5.7 (H) 4.8 - 5.6 %    Comment:          Prediabetes: 5.7 - 6.4          Diabetes: >6.4          Glycemic control for adults with diabetes: <7.0    Est. average glucose Bld gHb Est-mCnc 117 mg/dL  Vitamin B12     Status: None   Collection Time: 04/04/23  8:44 AM  Result Value Ref Range   Vitamin B-12 537 232 - 1,245 pg/mL  CBC with Differential/Platelet     Status: None   Collection Time: 04/04/23  8:44 AM  Result Value Ref Range   WBC 7.0 3.4 - 10.8 x10E3/uL   RBC 4.52 3.77 - 5.28 x10E6/uL   Hemoglobin 13.5 11.1 - 15.9 g/dL   Hematocrit 16.1 09.6 -  46.6 %   MCV 93 79 - 97 fL   MCH 29.9 26.6 - 33.0 pg   MCHC 32.2 31.5 - 35.7 g/dL   RDW 04.5 40.9 - 81.1 %   Platelets 287 150 - 450 x10E3/uL   Neutrophils 50 Not Estab. %   Lymphs 36 Not Estab. %   Monocytes 9 Not Estab. %   Eos 4 Not Estab. %   Basos 1 Not Estab. %   Neutrophils Absolute 3.5 1.4 - 7.0 x10E3/uL   Lymphocytes Absolute 2.5 0.7 - 3.1 x10E3/uL   Monocytes Absolute 0.6 0.1 - 0.9 x10E3/uL   EOS (ABSOLUTE) 0.3 0.0 - 0.4 x10E3/uL   Basophils Absolute 0.1 0.0 - 0.2 x10E3/uL   Immature Granulocytes 0 Not Estab. %   Immature Grans (Abs) 0.0 0.0 - 0.1 x10E3/uL       Assessment & Plan:   Problem List Items Addressed This Visit       Respiratory   Allergic rhinitis due to pollen   Patient stable.  Well controlled with current therapy.   Continue current meds.          Other   Mixed hyperlipidemia - Primary    Continue current therapy for lipid control. Will modify as needed based on labwork results.        Prediabetes   A1C Continues to be in prediabetic ranges.  Will reassess at follow up after next lab check.  Patient counseled on dietary choices and verbalized understanding.  Patient educated on foods that contain carbohydrates and the need to decrease intake.  We discussed prediabetes, and what it means and the need for strict dietary control to prevent progression to type 2 diabetes.  Advised to decrease intake of sugary drinks, including sodas, sweet tea, and some juices, and of starch and sugar heavy foods (ie., potatoes, rice, bread, pasta, desserts). She verbalizes understanding and agreement with the changes discussed today.        Other Visit Diagnoses       B12 deficiency due to diet         Primary insomnia  Sending a trial of trazodone  to help with her insomnia.  Will reassess at follow up.       Return in about 3 months (around 07/10/2023) for CPE/PAP.   Total time spent: 20 minutes  Trenda Frisk,  FNP  04/10/2023   This document may have been prepared by [facility] Voice Recognition software and as such may include unintentional dictation errors.

## 2023-05-08 ENCOUNTER — Other Ambulatory Visit: Payer: Self-pay | Admitting: Family

## 2023-06-04 ENCOUNTER — Encounter: Payer: Self-pay | Admitting: Family

## 2023-06-04 NOTE — Assessment & Plan Note (Signed)
 Continue current therapy for lipid control. Will modify as needed based on labwork results.

## 2023-06-04 NOTE — Assessment & Plan Note (Signed)
 Patient stable.  Well controlled with current therapy.   Continue current meds.

## 2023-06-04 NOTE — Patient Instructions (Signed)
 Checking labs today.  Will continue supplements as needed.

## 2023-06-04 NOTE — Assessment & Plan Note (Signed)

## 2023-07-05 ENCOUNTER — Other Ambulatory Visit: Payer: Self-pay | Admitting: Family

## 2023-07-10 ENCOUNTER — Ambulatory Visit: Admitting: Family

## 2023-07-19 ENCOUNTER — Encounter: Payer: Self-pay | Admitting: Family

## 2023-07-19 ENCOUNTER — Ambulatory Visit: Admitting: Family

## 2023-07-19 VITALS — BP 138/90 | HR 65 | Ht 59.0 in | Wt 165.0 lb

## 2023-07-19 DIAGNOSIS — Z1151 Encounter for screening for human papillomavirus (HPV): Secondary | ICD-10-CM

## 2023-07-19 DIAGNOSIS — Z0001 Encounter for general adult medical examination with abnormal findings: Secondary | ICD-10-CM

## 2023-07-19 DIAGNOSIS — Z0000 Encounter for general adult medical examination without abnormal findings: Secondary | ICD-10-CM

## 2023-07-19 DIAGNOSIS — Z1272 Encounter for screening for malignant neoplasm of vagina: Secondary | ICD-10-CM | POA: Diagnosis not present

## 2023-07-19 DIAGNOSIS — Z124 Encounter for screening for malignant neoplasm of cervix: Secondary | ICD-10-CM

## 2023-07-19 DIAGNOSIS — Z01419 Encounter for gynecological examination (general) (routine) without abnormal findings: Secondary | ICD-10-CM

## 2023-07-19 DIAGNOSIS — Z0130 Encounter for examination of blood pressure without abnormal findings: Secondary | ICD-10-CM

## 2023-07-19 NOTE — Progress Notes (Signed)
 Complete physical exam  Patient: Linda Simon   DOB: 1975-05-08   48 y.o. Female  MRN: [id number]  Subjective:    Chief Complaint  Patient presents with   PAP    PAP    Linda Simon is a 48 y.o. female who presents today for a complete physical exam. She reports consuming a general diet.  She generally feels fairly well. She reports sleeping fairly well. She does not have additional problems to discuss today.     Most recent depression screenings:    04/10/2023    5:00 PM  PHQ 2/9 Scores  PHQ - 2 Score 0  PHQ- 9 Score 4    Past Medical History:  Diagnosis Date   Allergic rhinitis    Mixed hyperlipidemia    Prediabetes     No past surgical history on file.  Family History  Problem Relation Age of Onset   Diabetes Mother    Breast cancer Neg Hx     Social History   Socioeconomic History   Marital status: Married    Spouse name: Not on file   Number of children: Not on file   Years of education: Not on file   Highest education level: Not on file  Occupational History   Not on file  Tobacco Use   Smoking status: Never   Smokeless tobacco: Never  Vaping Use   Vaping status: Never Used  Substance and Sexual Activity   Alcohol use: Never   Drug use: Never   Sexual activity: Yes    Birth control/protection: Pill  Other Topics Concern   Not on file  Social History Narrative   Not on file   Social Drivers of Health   Financial Resource Strain: Not on file  Food Insecurity: Not on file  Transportation Needs: Not on file  Physical Activity: Not on file  Stress: Not on file  Social Connections: Not on file  Intimate Partner Violence: Not on file    Outpatient Medications Prior to Visit  Medication Sig   azelastine  (ASTELIN ) 0.1 % nasal spray Place 1 spray into both nostrils 2 (two) times daily. Use in each nostril as directed   HAILEY 24 FE 1-20 MG-MCG(24) tablet TOME UNA TABLETA TODOS LOS DIAS AL MISMO TIEMPO   cloNIDine  (CATAPRES ) 0.1 MG tablet Take  1 tablet (0.1 mg total) by mouth at bedtime. (Patient not taking: Reported on 07/19/2023)   fexofenadine  (ALLEGRA ) 180 MG tablet Take 1 tablet (180 mg total) by mouth daily. (Patient not taking: Reported on 07/19/2023)   traZODone  (DESYREL ) 50 MG tablet TAKE 1 TABLET BY MOUTH AT BEDTIME AS NEEDED FOR SLEEP. (Patient not taking: Reported on 07/19/2023)   No facility-administered medications prior to visit.    Review of Systems  All other systems reviewed and are negative.       Objective:     BP (!) 138/90   Pulse 65   Ht 4' 11 (1.499 m)   Wt 165 lb (74.8 kg)   SpO2 99%   BMI 33.33 kg/m   Physical Exam Vitals and nursing note reviewed. Exam conducted with a chaperone present.  Constitutional:      Appearance: Normal appearance. She is normal weight.  HENT:     Head: Normocephalic.  Eyes:     Extraocular Movements: Extraocular movements intact.     Conjunctiva/sclera: Conjunctivae normal.     Pupils: Pupils are equal, round, and reactive to light.  Cardiovascular:     Rate and Rhythm: Normal  rate and regular rhythm.     Pulses: Normal pulses.     Heart sounds: Normal heart sounds.  Pulmonary:     Effort: Pulmonary effort is normal.     Breath sounds: Normal breath sounds.  Genitourinary:    General: Normal vulva.     Exam position: Lithotomy position.     Vagina: Normal.     Cervix: Discharge (white) present.     Uterus: Normal.      Adnexa: Right adnexa normal.  Neurological:     General: No focal deficit present.     Mental Status: She is alert and oriented to person, place, and time. Mental status is at baseline.  Psychiatric:        Mood and Affect: Mood normal.        Behavior: Behavior normal.        Thought Content: Thought content normal.        Judgment: Judgment normal.      No results found for any visits on 07/19/23.  No results found for this or any previous visit (from the past 2160 hours).      Assessment & Plan:    Routine Health  Maintenance and Physical Exam  Immunization History  Administered Date(s) Administered   PFIZER(Purple Top)SARS-COV-2 Vaccination 03/24/2019, 04/14/2019    Health Maintenance  Topic Date Due   HIV Screening  Never done   Hepatitis C Screening  Never done   DTaP/Tdap/Td (1 - Tdap) Never done   Hepatitis B Vaccines (1 of 3 - 19+ 3-dose series) Never done   Cervical Cancer Screening (HPV/Pap Cotest)  Never done   COVID-19 Vaccine (3 - Pfizer risk series) 05/12/2019   Colonoscopy  Never done   INFLUENZA VACCINE  08/04/2023   HPV VACCINES  Aged Out   Meningococcal B Vaccine  Aged Out    Discussed health benefits of physical activity, and encouraged her to engage in regular exercise appropriate for her age and condition.  Assessment & Plan Routine general medical examination at a health care facility Vaginal Pap smear Screening for cervical cancer Screening for human papillomavirus (HPV) Encounter for gynecological examination (general) (routine) without abnormal findings  Pap smear performed in office today.  Will call with results.  Patient declines STI testing.     Return in about 3 months (around 10/19/2023).     ALAN CHRISTELLA ARRANT, FNP  07/19/2023   This document may have been prepared by Dragon Voice Recognition software and as such may include unintentional dictation errors.

## 2023-07-23 LAB — IGP, APTIMA HPV
HPV Aptima: NEGATIVE
PAP Smear Comment: 0

## 2023-09-19 ENCOUNTER — Encounter: Payer: Self-pay | Admitting: Family
